# Patient Record
Sex: Female | Born: 1985
Health system: Southern US, Community
[De-identification: ages and names within clinical notes are randomized; demographics above are authoritative.]

## PROBLEM LIST (undated history)

## (undated) DIAGNOSIS — Z8759 Personal history of other complications of pregnancy, childbirth and the puerperium: Secondary | ICD-10-CM

## (undated) DIAGNOSIS — Z789 Other specified health status: Secondary | ICD-10-CM

## (undated) HISTORY — PX: ADENOIDECTOMY: SUR15

## (undated) HISTORY — PX: TYMPANOSTOMY TUBE PLACEMENT: SHX32

## (undated) HISTORY — PX: TONSILLECTOMY: SUR1361

---
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
## 2010-03-16 ENCOUNTER — Other Ambulatory Visit: Payer: Self-pay | Admitting: Obstetrics and Gynecology

## 2010-04-13 ENCOUNTER — Other Ambulatory Visit: Payer: Self-pay | Admitting: Obstetrics and Gynecology

## 2010-08-31 ENCOUNTER — Other Ambulatory Visit: Payer: Self-pay | Admitting: Obstetrics and Gynecology

## 2010-10-16 ENCOUNTER — Other Ambulatory Visit: Payer: Self-pay | Admitting: Obstetrics and Gynecology

## 2012-01-11 ENCOUNTER — Other Ambulatory Visit: Payer: Self-pay

## 2013-02-08 NOTE — L&D Delivery Note (Signed)
Operative Delivery Note  Patient pushed for 50 minutes and there was repetitive late decelerations with pushing. With the presence of thick meconium patient was previously consented for an operative delivery. erbal consent: obtained from patient.  Risks and benefits discussed in detail.  Risks include, but are not limited to the risks of anesthesia, bleeding, infection, damage to maternal tissues, fetal cephalhematoma.  There is also the risk of inability to effect vaginal delivery of the head, or shoulder dystocia that cannot be resolved by established maneuvers, leading to the need for emergency cesarean section. The foley catheter was previously removed, anesthesia was adequate.  The head was confirmed to be LOA. The Kiwi vacuum extractor was placed and with one pull and maternal effort at 7:07 PM a viable and healthy female was delivered via Vaginal, Vacuum Investment banker, operational(Extractor).  Presentation: vertex; Position: Left,, Occiput,, Anterior;  The cord expulsion occurred and the placenta had to be manually removed. A uterine sweep was performed to ensure all membranes and placenta had been removed. A dose of IV Unasyn administered due to the uterine sweep.  The placenta was inspected and noted to be intact   [facility] ; weight  Pending  Meconium suctioned by Neonatal team  Placenta status: , Manual removal.   Cord: 3 vessels with the following complications: .  Cord pH: pending  Anesthesia: Epidural  Instruments: Kiwi  Episiotomy: none Lacerations: 1st degree Suture Repair: 2.0 vicryl Est. Blood Loss (mL): 400  Mom to postpartum.  Baby to Couplet care / Skin to Skin.  Essie HartINN, Keshonna Valvo STACIA 09/22/2013, 7:43 PM

## 2013-03-02 LAB — OB RESULTS CONSOLE HEPATITIS B SURFACE ANTIGEN: Hepatitis B Surface Ag: NEGATIVE

## 2013-03-02 LAB — OB RESULTS CONSOLE ABO/RH: RH Type: POSITIVE

## 2013-03-02 LAB — OB RESULTS CONSOLE GC/CHLAMYDIA
Chlamydia: NEGATIVE
GC PROBE AMP, GENITAL: NEGATIVE

## 2013-03-02 LAB — OB RESULTS CONSOLE ANTIBODY SCREEN: ANTIBODY SCREEN: NEGATIVE

## 2013-03-02 LAB — OB RESULTS CONSOLE HIV ANTIBODY (ROUTINE TESTING): HIV: NONREACTIVE

## 2013-03-02 LAB — OB RESULTS CONSOLE RUBELLA ANTIBODY, IGM: Rubella: IMMUNE

## 2013-03-02 LAB — OB RESULTS CONSOLE RPR: RPR: NONREACTIVE

## 2013-04-04 ENCOUNTER — Other Ambulatory Visit: Payer: Self-pay

## 2013-08-27 LAB — OB RESULTS CONSOLE GBS: GBS: NEGATIVE

## 2013-09-10 ENCOUNTER — Encounter (HOSPITAL_COMMUNITY): Admission: RE | Payer: Self-pay | Source: Ambulatory Visit

## 2013-09-10 ENCOUNTER — Inpatient Hospital Stay (HOSPITAL_COMMUNITY)
Admission: RE | Admit: 2013-09-10 | Payer: BC Managed Care – PPO | Source: Ambulatory Visit | Admitting: Obstetrics and Gynecology

## 2013-09-10 SURGERY — Surgical Case
Anesthesia: Regional

## 2013-09-21 ENCOUNTER — Telehealth (HOSPITAL_COMMUNITY): Payer: Self-pay | Admitting: *Deleted

## 2013-09-21 NOTE — Telephone Encounter (Signed)
Preadmission screen  

## 2013-09-22 ENCOUNTER — Inpatient Hospital Stay (HOSPITAL_COMMUNITY)
Admission: AD | Admit: 2013-09-22 | Discharge: 2013-09-24 | DRG: 774 | Disposition: A | Discharge status: Home / Self Care | Payer: BC Managed Care – PPO | Source: Ambulatory Visit | Attending: Obstetrics & Gynecology | Admitting: Obstetrics & Gynecology

## 2013-09-22 ENCOUNTER — Encounter (HOSPITAL_COMMUNITY): Payer: BC Managed Care – PPO | Admitting: Anesthesiology

## 2013-09-22 ENCOUNTER — Inpatient Hospital Stay (HOSPITAL_COMMUNITY)
Admission: AD | Admit: 2013-09-22 | Discharge: 2013-09-22 | Disposition: A | Discharge status: Home / Self Care | Payer: BC Managed Care – PPO | Source: Ambulatory Visit | Attending: Obstetrics & Gynecology | Admitting: Obstetrics & Gynecology

## 2013-09-22 ENCOUNTER — Inpatient Hospital Stay (HOSPITAL_COMMUNITY): Payer: BC Managed Care – PPO | Admitting: Anesthesiology

## 2013-09-22 ENCOUNTER — Encounter (HOSPITAL_COMMUNITY): Payer: Self-pay | Admitting: *Deleted

## 2013-09-22 DIAGNOSIS — O479 False labor, unspecified: Secondary | ICD-10-CM | POA: Diagnosis present

## 2013-09-22 DIAGNOSIS — O99892 Other specified diseases and conditions complicating childbirth: Secondary | ICD-10-CM | POA: Diagnosis present

## 2013-09-22 DIAGNOSIS — M4180 Other forms of scoliosis, site unspecified: Secondary | ICD-10-CM | POA: Diagnosis present

## 2013-09-22 DIAGNOSIS — O9989 Other specified diseases and conditions complicating pregnancy, childbirth and the puerperium: Secondary | ICD-10-CM

## 2013-09-22 DIAGNOSIS — O480 Post-term pregnancy: Secondary | ICD-10-CM | POA: Diagnosis present

## 2013-09-22 DIAGNOSIS — IMO0001 Reserved for inherently not codable concepts without codable children: Secondary | ICD-10-CM

## 2013-09-22 DIAGNOSIS — Z8759 Personal history of other complications of pregnancy, childbirth and the puerperium: Secondary | ICD-10-CM

## 2013-09-22 HISTORY — DX: Other specified health status: Z78.9

## 2013-09-22 LAB — CBC
HCT: 36.8 % (ref 36.0–46.0)
HEMOGLOBIN: 13.3 g/dL (ref 12.0–15.0)
MCH: 34.4 pg — ABNORMAL HIGH (ref 26.0–34.0)
MCHC: 36.1 g/dL — ABNORMAL HIGH (ref 30.0–36.0)
MCV: 95.1 fL (ref 78.0–100.0)
Platelets: 200 10*3/uL (ref 150–400)
RBC: 3.87 MIL/uL (ref 3.87–5.11)
RDW: 12.9 % (ref 11.5–15.5)
WBC: 19.9 10*3/uL — ABNORMAL HIGH (ref 4.0–10.5)

## 2013-09-22 LAB — CORD BLOOD GAS (ARTERIAL)

## 2013-09-22 MED ORDER — ONDANSETRON HCL 4 MG PO TABS
4.0000 mg | ORAL_TABLET | ORAL | Status: DC | PRN
Start: 2013-09-22 — End: 2013-09-24

## 2013-09-22 MED ORDER — CITRIC ACID-SODIUM CITRATE 334-500 MG/5ML PO SOLN: 30.0000 mL | ORAL | Status: DC | PRN

## 2013-09-22 MED ORDER — TETANUS-DIPHTH-ACELL PERTUSSIS 5-2.5-18.5 LF-MCG/0.5 IM SUSP: 0.5000 mL | Freq: Once | INTRAMUSCULAR | Status: DC

## 2013-09-22 MED ORDER — PHENYLEPHRINE 40 MCG/ML (10ML) SYRINGE FOR IV PUSH (FOR BLOOD PRESSURE SUPPORT)
80.0000 ug | PREFILLED_SYRINGE | INTRAVENOUS | Status: DC | PRN
  Filled 2013-09-22: qty 2

## 2013-09-22 MED ORDER — FENTANYL 2.5 MCG/ML BUPIVACAINE 1/10 % EPIDURAL INFUSION (WH - ANES)
14.0000 mL/h | INTRAMUSCULAR | Status: DC | PRN
  Administered 2013-09-22: 14 mL/h via EPIDURAL

## 2013-09-22 MED ORDER — LANOLIN HYDROUS EX OINT: TOPICAL_OINTMENT | CUTANEOUS | Status: DC | PRN

## 2013-09-22 MED ORDER — DIPHENHYDRAMINE HCL 50 MG/ML IJ SOLN: 12.5000 mg | INTRAMUSCULAR | Status: DC | PRN

## 2013-09-22 MED ORDER — FENTANYL 2.5 MCG/ML BUPIVACAINE 1/10 % EPIDURAL INFUSION (WH - ANES)
INTRAMUSCULAR | Status: DC | PRN
  Administered 2013-09-22: 14 mL/h via EPIDURAL

## 2013-09-22 MED ORDER — SIMETHICONE 80 MG PO CHEW: 80.0000 mg | CHEWABLE_TABLET | ORAL | Status: DC | PRN

## 2013-09-22 MED ORDER — EPHEDRINE 5 MG/ML INJ
10.0000 mg | INTRAVENOUS | Status: DC | PRN
  Filled 2013-09-22: qty 2

## 2013-09-22 MED ORDER — ONDANSETRON HCL 4 MG/2ML IJ SOLN: 4.0000 mg | Freq: Four times a day (QID) | INTRAMUSCULAR | Status: DC | PRN

## 2013-09-22 MED ORDER — PRENATAL MULTIVITAMIN CH: 1.0000 | ORAL_TABLET | Freq: Every day | ORAL | Status: DC

## 2013-09-22 MED ORDER — DIPHENHYDRAMINE HCL 25 MG PO CAPS: 25.0000 mg | ORAL_CAPSULE | Freq: Four times a day (QID) | ORAL | Status: DC | PRN

## 2013-09-22 MED ORDER — ONDANSETRON HCL 4 MG/2ML IJ SOLN: 4.0000 mg | INTRAMUSCULAR | Status: DC | PRN

## 2013-09-22 MED ORDER — OXYTOCIN 40 UNITS IN LACTATED RINGERS INFUSION - SIMPLE MED: 62.5000 mL/h | INTRAVENOUS | Status: DC | PRN

## 2013-09-22 MED ORDER — OXYTOCIN 40 UNITS IN LACTATED RINGERS INFUSION - SIMPLE MED
62.5000 mL/h | INTRAVENOUS | Status: DC
  Filled 2013-09-22: qty 1000

## 2013-09-22 MED ORDER — DIBUCAINE 1 % RE OINT: 1.0000 "application " | TOPICAL_OINTMENT | RECTAL | Status: DC | PRN

## 2013-09-22 MED ORDER — PHENYLEPHRINE 40 MCG/ML (10ML) SYRINGE FOR IV PUSH (FOR BLOOD PRESSURE SUPPORT)
PREFILLED_SYRINGE | INTRAVENOUS | Status: DC
Start: 2013-09-22 — End: 2013-09-22
  Filled 2013-09-22: qty 10

## 2013-09-22 MED ORDER — LACTATED RINGERS IV SOLN: 500.0000 mL | INTRAVENOUS | Status: DC | PRN

## 2013-09-22 MED ORDER — ZOLPIDEM TARTRATE 5 MG PO TABS: 5.0000 mg | ORAL_TABLET | Freq: Every evening | ORAL | Status: DC | PRN

## 2013-09-22 MED ORDER — FLEET ENEMA 7-19 GM/118ML RE ENEM: 1.0000 | ENEMA | RECTAL | Status: DC | PRN

## 2013-09-22 MED ORDER — OXYCODONE-ACETAMINOPHEN 5-325 MG PO TABS: 1.0000 | ORAL_TABLET | ORAL | Status: DC | PRN

## 2013-09-22 MED ORDER — WITCH HAZEL-GLYCERIN EX PADS
1.0000 | MEDICATED_PAD | CUTANEOUS | Status: DC | PRN
Start: 2013-09-22 — End: 2013-09-24

## 2013-09-22 MED ORDER — IBUPROFEN 600 MG PO TABS
600.0000 mg | ORAL_TABLET | Freq: Four times a day (QID) | ORAL | Status: DC
  Administered 2013-09-23 – 2013-09-24 (×5): 600 mg via ORAL
  Filled 2013-09-22 (×6): qty 1

## 2013-09-22 MED ORDER — FENTANYL 2.5 MCG/ML BUPIVACAINE 1/10 % EPIDURAL INFUSION (WH - ANES)
INTRAMUSCULAR | Status: AC
  Filled 2013-09-22: qty 125

## 2013-09-22 MED ORDER — LIDOCAINE HCL (PF) 1 % IJ SOLN
INTRAMUSCULAR | Status: DC | PRN
  Administered 2013-09-22 (×2): 4 mL

## 2013-09-22 MED ORDER — METHYLERGONOVINE MALEATE 0.2 MG/ML IJ SOLN
0.2000 mg | Freq: Once | INTRAMUSCULAR | Status: AC
  Administered 2013-09-22: 0.2 mg via INTRAMUSCULAR
  Filled 2013-09-22: qty 1

## 2013-09-22 MED ORDER — IBUPROFEN 600 MG PO TABS
600.0000 mg | ORAL_TABLET | Freq: Four times a day (QID) | ORAL | Status: DC | PRN
  Administered 2013-09-23: 600 mg via ORAL

## 2013-09-22 MED ORDER — BENZOCAINE-MENTHOL 20-0.5 % EX AERO
1.0000 | INHALATION_SPRAY | CUTANEOUS | Status: DC | PRN
Start: 2013-09-22 — End: 2013-09-24

## 2013-09-22 MED ORDER — LACTATED RINGERS IV SOLN
INTRAVENOUS | Status: DC
  Administered 2013-09-22: 1000 mL via INTRAVENOUS
  Administered 2013-09-22: 700 mL via INTRAVENOUS

## 2013-09-22 MED ORDER — SODIUM CHLORIDE 0.9 % IV SOLN
3.0000 g | Freq: Once | INTRAVENOUS | Status: AC
  Administered 2013-09-22: 3 g via INTRAVENOUS
  Filled 2013-09-22: qty 3

## 2013-09-22 MED ORDER — LACTATED RINGERS IV SOLN: 500.0000 mL | Freq: Once | INTRAVENOUS | Status: DC

## 2013-09-22 MED ORDER — ACETAMINOPHEN 325 MG PO TABS: 650.0000 mg | ORAL_TABLET | ORAL | Status: DC | PRN

## 2013-09-22 MED ORDER — SENNOSIDES-DOCUSATE SODIUM 8.6-50 MG PO TABS
2.0000 | ORAL_TABLET | ORAL | Status: DC
  Administered 2013-09-23 – 2013-09-24 (×2): 2 via ORAL
  Filled 2013-09-22 (×2): qty 2

## 2013-09-22 MED ORDER — LIDOCAINE HCL (PF) 1 % IJ SOLN
30.0000 mL | INTRAMUSCULAR | Status: DC | PRN
  Filled 2013-09-22: qty 30

## 2013-09-22 MED ORDER — OXYTOCIN BOLUS FROM INFUSION: 500.0000 mL | INTRAVENOUS | Status: DC

## 2013-09-22 NOTE — Anesthesia Preprocedure Evaluation (Addendum)
Anesthesia Evaluation  Patient identified by MRN, date of birth, ID band Patient awake    Reviewed: Allergy & Precautions, H&P , Patient's Chart, lab work & pertinent test results  Airway Mallampati: III TM Distance: >3 FB Neck ROM: full    Dental no notable dental hx. (+) Teeth Intact   Pulmonary neg pulmonary ROS,  breath sounds clear to auscultation  Pulmonary exam normal       Cardiovascular negative cardio ROS  Rhythm:regular Rate:Normal     Neuro/Psych negative neurological ROS  negative psych ROS   GI/Hepatic negative GI ROS, Neg liver ROS,   Endo/Other  negative endocrine ROS  Renal/GU negative Renal ROS  negative genitourinary   Musculoskeletal Scoliosis thoracolumbar spine   Abdominal Normal abdominal exam  (+)   Peds  Hematology negative hematology ROS (+)   Anesthesia Other Findings   Reproductive/Obstetrics (+) Pregnancy                          Anesthesia Physical Anesthesia Plan  ASA: II  Anesthesia Plan: Epidural   Post-op Pain Management:    Induction:   Airway Management Planned:   Additional Equipment:   Intra-op Plan:   Post-operative Plan:   Informed Consent: I have reviewed the patients History and Physical, chart, labs and discussed the procedure including the risks, benefits and alternatives for the proposed anesthesia with the patient or authorized representative who has indicated his/her understanding and acceptance.     Plan Discussed with: Anesthesiologist  Anesthesia Plan Comments:         Anesthesia Quick Evaluation

## 2013-09-22 NOTE — Consult Note (Signed)
Neonatology Note:  Attendance at Delivery:  I was asked by Dr. Pinn to attend this vacuum-assisted vaginal delivery at [redacted] weeks GA due to thick meconium and NRFHR. The mother is a G1P0 O pos, GBS neg with an uncomplicated pregnancy. ROM 1 hour before delivery, fluid with thick meconium. At delivery, the baby had good tone; I bulb suctioned a large amount of thick meconium from her mouth and nares while the cord was being clamped and cut. Infant vigorous with good spontaneous cry and tone. The umbilical cord was deeply meconium-stained. We performed DeLee suctioning to the stomach, and only got about 1 ml out. Ap 8/9. Lungs clear to ausc in DR. To CN to care of Pediatrician.  Rhonda Pennington C. Ana Liaw, MD  

## 2013-09-22 NOTE — MAU Note (Signed)
Pt states was 2cm in office at last check, denies bleeding or lof. Unsure of contractions pattern however are "frequent".

## 2013-09-22 NOTE — H&P (Signed)
Rhonda Pennington is a 28 y.o. female G1P0 SIUP at 46 weeks presenting for regular painful contractions.  Maternal Medical History:  Reason for admission: Contractions.   Contractions: Onset was 6-12 hours ago.   Frequency: regular.   Duration is approximately 60 seconds.   Perceived severity is strong.    Fetal activity: Perceived fetal activity is normal.   Last perceived fetal movement was within the past 12 hours.    Prenatal complications: no prenatal complications Prenatal Complications - Diabetes: none.    OB History   Grav Para Term Preterm Abortions TAB SAB Ect Mult Living   1 0 0 0 0 0 0 0 0 0      Past Medical History  Diagnosis Date  . Medical history non-contributory    Past Surgical History  Procedure Laterality Date  . Ovarian cyst surgery Right 2003   Family History: family history is not on file. Social History:  reports that she has never smoked. She has never used smokeless tobacco. She reports that she does not drink alcohol or use illicit drugs.   Prenatal Transfer Tool  Maternal Diabetes: No Genetic Screening: Normal Maternal Ultrasounds/Referrals: Normal Fetal Ultrasounds or other Referrals:  Other: July 17: EFW 3199 grams vertex Maternal Substance Abuse:  No Significant Maternal Medications:  None Significant Maternal Lab Results:  Lab values include: Group B Strep negative Other Comments:  None  Review of Systems  Constitutional: Negative.  Negative for fever.  Eyes: Negative.  Negative for blurred vision.  Respiratory: Negative for cough.   Cardiovascular: Negative for chest pain.  Gastrointestinal: Negative.  Negative for heartburn.  Genitourinary: Negative for dysuria.  Musculoskeletal: Negative for myalgias.  Skin: Negative for rash.  Neurological: Positive for dizziness. Negative for headaches.  Endo/Heme/Allergies: Negative.  Does not bruise/bleed easily.  Psychiatric/Behavioral: Negative for depression.  All other systems reviewed and  are negative.   Dilation: 10 Effacement (%): 100 Station: +2 Exam by:: Dr Mora Appl Blood pressure 122/75, pulse 77, temperature 98 F (36.7 C), temperature source Oral, resp. rate 20, height 5\' 7"  (1.702 m), weight 80.513 kg (177 lb 8 oz), SpO2 100.00%. Maternal Exam:  Uterine Assessment: Contraction strength is moderate.  Contraction duration is 60 seconds. Contraction frequency is regular.   Abdomen: Patient reports no abdominal tenderness. Fundal height is 40 cm.   Estimated fetal weight is 3400 grams.   Fetal presentation: vertex  Introitus: Normal vulva. Normal vagina.  Ferning test: not done.  Nitrazine test: not done. Amniotic fluid character: not assessed.  Pelvis: adequate for delivery.   Cervix: On admission Cvx: 4/ 90/-1   Fetal Exam Fetal Monitor Review: Mode: hand-held doppler probe.   Baseline rate: 150.  Variability: minimal (<5 bpm).   Pattern: late decelerations and accelerations present.    Fetal State Assessment: Category II - tracings are indeterminate.     Physical Exam  Vitals reviewed. Constitutional: She is oriented to person, place, and time. She appears well-developed and well-nourished.  HENT:  Head: Normocephalic.  Eyes: Pupils are equal, round, and reactive to light.  Neck: Normal range of motion.  Cardiovascular: Normal rate and regular rhythm.   Respiratory: Effort normal.  GI: Soft.  Musculoskeletal: Normal range of motion.  Neurological: She is alert and oriented to person, place, and time.  Skin: Skin is warm.    Prenatal labs: ABO, Rh: O/Positive/-- (01/23 0000) Antibody: Negative (01/23 0000) Rubella: Immune (01/23 0000) RPR: Nonreactive (01/23 0000)  HBsAg: Negative (01/23 0000)  HIV: Non-reactive (01/23 0000)  GBS:  Negative (07/20 0000)   Assessment/Plan: SIUP at 41 weeks in early active labor Cat II tracing Will admit to Labor and Delivery Epidural  Continuous monitoring   Dewey Neukam STACIA 09/22/2013, 6:40  PM

## 2013-09-22 NOTE — Progress Notes (Signed)
Rhonda Pennington is a 28 y.o. G1P0000 at 6774w0d by LMP admitted for active labor  Subjective: Patient feeling more pressure  Objective: BP 122/75  Pulse 77  Temp(Src) 98 F (36.7 C) (Oral)  Resp 20  Ht 5\' 7"  (1.702 m)  Wt 80.513 kg (177 lb 8 oz)  BMI 27.79 kg/m2  SpO2 100%      FHT:  FHR: 150 bpm, variability: minimal ,  accelerations:  Abscent,  decelerations:  Present intermittent late decelerations UC:   regular, every 2-3 minutes SVE:   Dilation: 10 Effacement (%): 100 Station: +2 Exam by:: Dr Mora ApplPinn  AROM: thick meconium  Labs: Lab Results  Component Value Date   WBC 19.9* 09/22/2013   HGB 13.3 09/22/2013   HCT 36.8 09/22/2013   MCV 95.1 09/22/2013   PLT 200 09/22/2013    Assessment / Plan: Spontaneous labor, progressing normally  Labor: Progressing normally Preeclampsia:  no signs or symptoms of toxicity Fetal Wellbeing:  Category II Pain Control:  Epidural I/D:  n/a Anticipated MOD:  NSVD  Will have Peds at time of delivery due to Cat II tracing and Thick meconium   Rhonda Pennington Rhonda Pennington 09/22/2013, 6:46 PM

## 2013-09-22 NOTE — Anesthesia Procedure Notes (Signed)
Epidural Patient location during procedure: OB Start time: 09/22/2013 4:40 PM  Staffing Anesthesiologist: Demonta Wombles A. Performed by: anesthesiologist   Preanesthetic Checklist Completed: patient identified, site marked, surgical consent, pre-op evaluation, timeout performed, IV checked, risks and benefits discussed and monitors and equipment checked  Epidural Patient position: sitting Prep: site prepped and draped and DuraPrep Patient monitoring: continuous pulse ox and blood pressure Approach: midline Location: L3-L4 Injection technique: LOR air  Needle:  Needle type: Tuohy  Needle gauge: 17 G Needle length: 9 cm and 9 Needle insertion depth: 5 cm cm Catheter type: closed end flexible Catheter size: 19 Gauge Catheter at skin depth: 10 cm Test dose: negative and Other  Assessment Events: blood not aspirated, injection not painful, no injection resistance, negative IV test and no paresthesia  Additional Notes Patient identified. Risks and benefits discussed including failed block, incomplete  Pain control, post dural puncture headache, nerve damage, paralysis, blood pressure Changes, nausea, vomiting, reactions to medications-both toxic and allergic and post Partum back pain. All questions were answered. Patient expressed understanding and wished to proceed. Sterile technique was used throughout procedure. Epidural site was Dressed with sterile barrier dressing. No paresthesias, signs of intravascular injection Or signs of intrathecal spread were encountered.  Patient was more comfortable after the epidural was dosed. Please see RN's note for documentation of vital signs and FHR which are stable.

## 2013-09-23 ENCOUNTER — Inpatient Hospital Stay (HOSPITAL_COMMUNITY): Admission: RE | Admit: 2013-09-23 | Payer: BC Managed Care – PPO | Source: Ambulatory Visit

## 2013-09-23 LAB — CBC
HCT: 32.9 % — ABNORMAL LOW (ref 36.0–46.0)
Hemoglobin: 11.8 g/dL — ABNORMAL LOW (ref 12.0–15.0)
MCH: 34.3 pg — AB (ref 26.0–34.0)
MCHC: 35.9 g/dL (ref 30.0–36.0)
MCV: 95.6 fL (ref 78.0–100.0)
PLATELETS: 186 10*3/uL (ref 150–400)
RBC: 3.44 MIL/uL — ABNORMAL LOW (ref 3.87–5.11)
RDW: 13 % (ref 11.5–15.5)
WBC: 18.5 10*3/uL — ABNORMAL HIGH (ref 4.0–10.5)

## 2013-09-23 LAB — ABO/RH: ABO/RH(D): O POS

## 2013-09-23 LAB — RPR

## 2013-09-23 NOTE — Lactation Note (Signed)
This note was copied from the chart of [facility]. Lactation Consultation Note  Patient Name: Rhonda Avie ArenasStacey Pennington ZOXWR'UToday's Date: 09/23/2013 Reason for consult: Initial assessment Baby 21 hours of life. Mom has room full of visitors, ask LC to come back later. Enc mom to call out for LC at next feeding. [facility].   Maternal Data    Feeding Feeding Type: Breast Fed Length of feed: 15 min  LATCH Score/Interventions Latch: Grasps breast easily, tongue down, lips flanged, rhythmical sucking.  Audible Swallowing: None  Type of Nipple: Everted at rest and after stimulation  Comfort (Breast/Nipple): Soft / non-tender     Hold (Positioning): No assistance needed to correctly position infant at breast.  LATCH Score: 8  Lactation Tools Discussed/Used     Consult Status Consult Status: Follow-up Date: 09/24/13 Follow-up type: In-patient    Geralynn OchsWILLIARD, Rhonda Pennington 09/23/2013, 4:33 PM

## 2013-09-23 NOTE — Progress Notes (Signed)
Post Partum Day 1 Subjective: no complaints, up ad lib, voiding, tolerating PO, + flatus and Mother is breast feeding, bonding well with baby  Objective: Blood pressure 131/86, pulse 67, temperature 98.8 F (37.1 C), temperature source Oral, resp. rate 18, height 5\' 7"  (1.702 m), weight 80.513 kg (177 lb 8 oz), SpO2 98.00%, unknown if currently breastfeeding.  Physical Exam:  General: alert, cooperative and no distress Lochia: appropriate Uterine Fundus: firm DVT Evaluation: No evidence of DVT seen on physical exam. Negative Homan's sign. No cords or calf tenderness.   Recent Labs  09/22/13 1557 09/23/13 0549  HGB 13.3 11.8*  HCT 36.8 32.9*    Assessment/Plan: Plan for discharge tomorrow and Breastfeeding   LOS: 1 day   Rhonda Pennington STACIA 09/23/2013, 10:38 AM

## 2013-09-23 NOTE — Anesthesia Postprocedure Evaluation (Signed)
  Anesthesia Post-op Note  [facility]  Procedure(s) Performed: * No procedures listed *  Patient Location: Mother/Baby  Anesthesia Type:Epidural  Level of Consciousness: awake and alert   Airway and Oxygen Therapy: Patient Spontanous Breathing  Post-op Pain: mild  Post-op Assessment: Post-op Vital signs reviewed, No signs of Nausea or vomiting, Pain level controlled, No headache, No residual numbness and No residual motor weakness  Post-op Vital Signs: Reviewed  Last Vitals:  Filed Vitals:   09/22/13 2309  BP: 131/86  Pulse: 67  Temp: 37.1 C  Resp: 18    Complications: No apparent anesthesia complications

## 2013-09-24 MED ORDER — OXYCODONE-ACETAMINOPHEN 5-325 MG PO TABS: 1.0000 | ORAL_TABLET | ORAL | Status: DC | PRN

## 2013-09-24 NOTE — Progress Notes (Signed)
PPD#2 Pt without complaints. Would like to go home. VSSAF IMP/ stable Plan/ Will discharge.  

## 2013-09-24 NOTE — Lactation Note (Signed)
This note was copied from the chart of [facility]. Lactation Consultation Note Went into rm. Multiple times, mom sleeping. RN fitted mom w/#20 NS.  Patient Name: Rhonda Pennington QIHKV'QToday's Date: 09/24/2013     Maternal Data    Feeding Feeding Type: Formula Nipple Type: Slow - flow  LATCH Score/Interventions                      Lactation Tools Discussed/Used     Consult Status      Charyl DancerCARVER, Lalita Ebel G 09/24/2013, 6:27 AM

## 2013-09-24 NOTE — Lactation Note (Signed)
This note was copied from the chart of Rhonda Pennington. Lactation Consultation Note  Per Odis LusterJennifer Gray RN Mother states she does not need to see Lactation prior to discharge. Victorino DikeJennifer reviewed pumping options and rental pumps if needed.  Mom is considering primarily formula feeding at this time.  Patient Name: Rhonda Avie ArenasStacey Pennington ZOXWR'UToday's Date: 09/24/2013     Maternal Data    Feeding Feeding Type: Bottle Fed - Formula Nipple Type: Slow - flow  LATCH Score/Interventions                      Lactation Tools Discussed/Used     Consult Status      Hardie PulleyBerkelhammer, Ruth Boschen 09/24/2013, 8:49 AM

## 2013-09-24 NOTE — Discharge Summary (Signed)
Obstetric Discharge Summary Reason for Admission: onset of labor Prenatal Procedures: ultrasound Intrapartum Procedures: vacuum Postpartum Procedures: none Complications-Operative and Postpartum: 1st degree perineal laceration Hemoglobin  Date Value Ref Range Status  09/23/2013 11.8* 12.0 - 15.0 g/dL Final     HCT  Date Value Ref Range Status  09/23/2013 32.9* 36.0 - 46.0 % Final    Physical Exam:  General: alert Lochia: appropriate Uterine Fundus: firm   Discharge Diagnoses: Term Pregnancy-delivered  Discharge Information: Date: 09/24/2013 Activity: pelvic rest Diet: routine Medications: PNV and Percocet Condition: stable Instructions: refer to practice specific booklet Discharge to: home Follow-up Information   [facility] Mae[facility]ALDA STACIA, MD. Schedule an appointment as soon as possible for a visit in 1 month.   Specialty:  Obstetrics and Gynecology   Contact information:   [address] [place] [id number]       Newborn Data: Live born female  Birth Weight: 6 lb 14.8 oz (3141 g) APGAR: 8, 9  Home with mother.  Rhonda Pennington E 09/24/2013, 8:59 AM

## 2013-12-10 ENCOUNTER — Encounter (HOSPITAL_COMMUNITY): Payer: Self-pay | Admitting: *Deleted

## 2014-02-02 ENCOUNTER — Emergency Department (HOSPITAL_COMMUNITY)
Admission: EM | Admit: 2014-02-02 | Discharge: 2014-02-02 | Disposition: A | Discharge status: Home / Self Care | Payer: BC Managed Care – PPO | Attending: Emergency Medicine | Admitting: Emergency Medicine

## 2014-02-02 DIAGNOSIS — Y998 Other external cause status: Secondary | ICD-10-CM | POA: Insufficient documentation

## 2014-02-02 DIAGNOSIS — S199XXA Unspecified injury of neck, initial encounter: Secondary | ICD-10-CM | POA: Diagnosis present

## 2014-02-02 DIAGNOSIS — Z79891 Long term (current) use of opiate analgesic: Secondary | ICD-10-CM | POA: Insufficient documentation

## 2014-02-02 DIAGNOSIS — S4992XA Unspecified injury of left shoulder and upper arm, initial encounter: Secondary | ICD-10-CM | POA: Diagnosis not present

## 2014-02-02 DIAGNOSIS — Y9389 Activity, other specified: Secondary | ICD-10-CM | POA: Diagnosis not present

## 2014-02-02 DIAGNOSIS — Y92410 Unspecified street and highway as the place of occurrence of the external cause: Secondary | ICD-10-CM | POA: Diagnosis not present

## 2014-02-02 DIAGNOSIS — S29001A Unspecified injury of muscle and tendon of front wall of thorax, initial encounter: Secondary | ICD-10-CM | POA: Diagnosis not present

## 2014-02-02 DIAGNOSIS — Z79899 Other long term (current) drug therapy: Secondary | ICD-10-CM | POA: Diagnosis not present

## 2014-02-02 MED ORDER — ACETAMINOPHEN 500 MG PO TABS
1000.0000 mg | ORAL_TABLET | Freq: Once | ORAL | Status: AC
  Administered 2014-02-02: 1000 mg via ORAL
  Filled 2014-02-02: qty 2

## 2014-02-02 NOTE — ED Notes (Signed)
DISCHARGE INSTRUCTIONS REVIEWED WITH PT

## 2014-02-02 NOTE — Discharge Instructions (Signed)
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
## 2014-03-04 ENCOUNTER — Other Ambulatory Visit: Payer: Self-pay | Admitting: Internal Medicine

## 2014-03-04 ENCOUNTER — Ambulatory Visit
Admission: RE | Admit: 2014-03-04 | Discharge: 2014-03-04 | Disposition: A | Discharge status: Home / Self Care | Payer: BLUE CROSS/BLUE SHIELD | Source: Ambulatory Visit | Attending: Internal Medicine | Admitting: Internal Medicine

## 2014-03-04 DIAGNOSIS — Q742 Other congenital malformations of lower limb(s), including pelvic girdle: Secondary | ICD-10-CM
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
## 2016-03-15 IMAGING — CR DG TIBIA/FIBULA 2V*L*
2 series · 2 of 2 positions shown · non-contrast
Comparison: [REDACTED] of Julija Andreja from 07/09/2008
(date somewhat difficult to read on films but appears to be from
5323).

CLINICAL DATA: New distal leg pain with known bony tumor in the
distal left tibia.

EXAM:
LEFT TIBIA AND FIBULA - 2 VIEW

[view not recorded (1 of 2)]
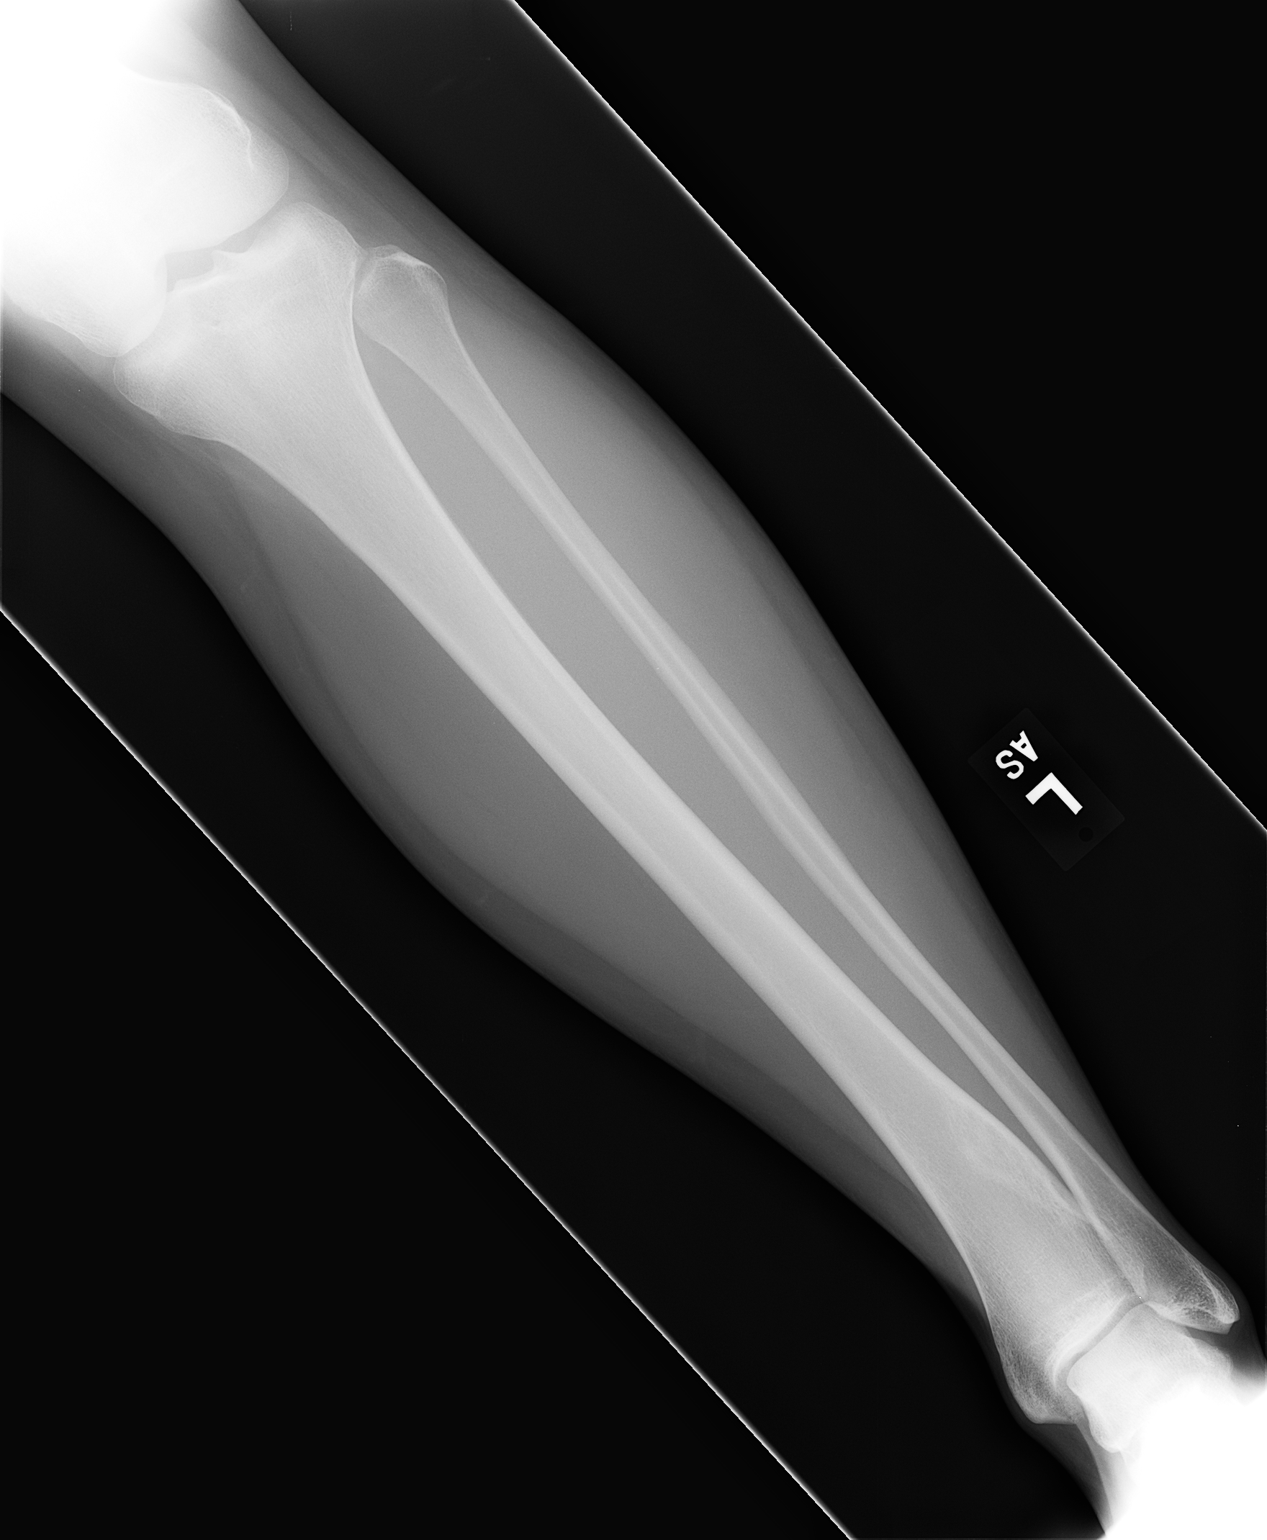

[view not recorded (2 of 2)]
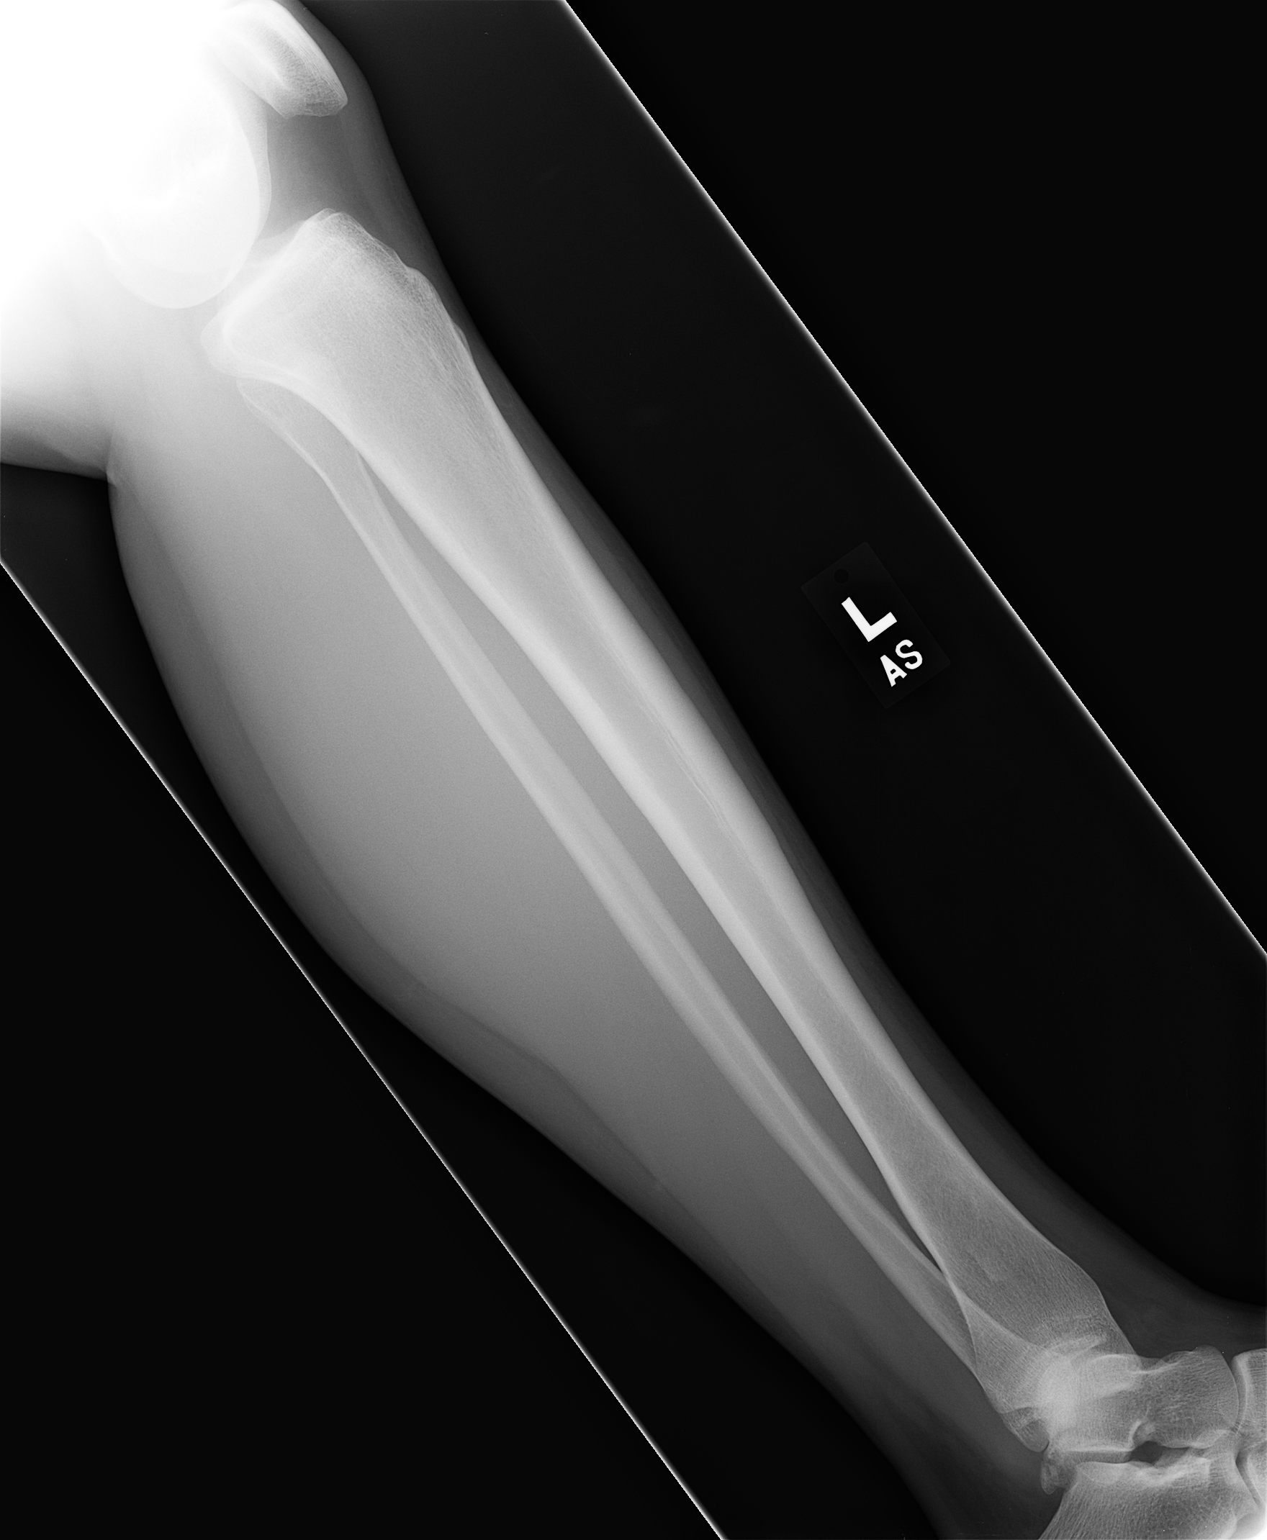

[2 of 2 positions shown; findings below may reference images not displayed]

FINDINGS: No evidence for fracture. No worrisome lytic lesion. There is a
cortically based lesion in the lateral aspect of the distal tibial
metaphysis, completely unchanged when compared to the films from
nearly 6 years ago. There is no evidence for cortical irregularity
or periosteal new bone formation. Imaging features are compatible
with a healed nonossifying fibroma.
IMPRESSION: Stable appearance of a healed nonossifying fibroma in the distal
tibial metaphysis. No new or progressive interval findings.

## 2016-07-14 LAB — OB RESULTS CONSOLE RPR: RPR: NONREACTIVE

## 2016-07-14 LAB — OB RESULTS CONSOLE HEPATITIS B SURFACE ANTIGEN: HEP B S AG: NEGATIVE

## 2016-07-14 LAB — OB RESULTS CONSOLE GC/CHLAMYDIA
CHLAMYDIA, DNA PROBE: NEGATIVE
Gonorrhea: NEGATIVE

## 2016-07-14 LAB — OB RESULTS CONSOLE ABO/RH: RH TYPE: POSITIVE

## 2016-07-14 LAB — OB RESULTS CONSOLE RUBELLA ANTIBODY, IGM: Rubella: IMMUNE

## 2016-07-14 LAB — OB RESULTS CONSOLE ANTIBODY SCREEN: Antibody Screen: NEGATIVE

## 2016-07-14 LAB — OB RESULTS CONSOLE HIV ANTIBODY (ROUTINE TESTING): HIV: NONREACTIVE

## 2016-12-27 LAB — OB RESULTS CONSOLE GBS: GBS: NEGATIVE

## 2017-01-06 ENCOUNTER — Other Ambulatory Visit: Payer: Self-pay | Admitting: Obstetrics and Gynecology

## 2017-01-07 ENCOUNTER — Telehealth (HOSPITAL_COMMUNITY): Payer: Self-pay | Admitting: *Deleted

## 2017-01-07 ENCOUNTER — Encounter (HOSPITAL_COMMUNITY): Payer: Self-pay | Admitting: *Deleted

## 2017-01-07 NOTE — Telephone Encounter (Signed)
Preadmission screen  

## 2017-01-20 ENCOUNTER — Inpatient Hospital Stay (HOSPITAL_COMMUNITY)
Admission: RE | Admit: 2017-01-20 | Discharge: 2017-01-22 | DRG: 768 | Disposition: A | Discharge status: Home / Self Care | Payer: Self-pay | Source: Ambulatory Visit | Attending: Obstetrics and Gynecology | Admitting: Obstetrics and Gynecology

## 2017-01-20 ENCOUNTER — Inpatient Hospital Stay (HOSPITAL_COMMUNITY): Payer: Self-pay | Admitting: Anesthesiology

## 2017-01-20 ENCOUNTER — Encounter (HOSPITAL_COMMUNITY): Payer: Self-pay

## 2017-01-20 DIAGNOSIS — Z3A39 39 weeks gestation of pregnancy: Secondary | ICD-10-CM

## 2017-01-20 DIAGNOSIS — O9902 Anemia complicating childbirth: Principal | ICD-10-CM | POA: Diagnosis present

## 2017-01-20 DIAGNOSIS — D649 Anemia, unspecified: Secondary | ICD-10-CM | POA: Diagnosis present

## 2017-01-20 LAB — COMPREHENSIVE METABOLIC PANEL
ALK PHOS: 193 U/L — AB (ref 38–126)
ALT: 15 U/L (ref 14–54)
ANION GAP: 8 (ref 5–15)
AST: 27 U/L (ref 15–41)
Albumin: 2.9 g/dL — ABNORMAL LOW (ref 3.5–5.0)
BILIRUBIN TOTAL: 0.5 mg/dL (ref 0.3–1.2)
BUN: 11 mg/dL (ref 6–20)
CALCIUM: 8.7 mg/dL — AB (ref 8.9–10.3)
CO2: 23 mmol/L (ref 22–32)
Chloride: 106 mmol/L (ref 101–111)
Creatinine, Ser: 0.64 mg/dL (ref 0.44–1.00)
GFR calc non Af Amer: >60 mL/min (ref >60)
GLUCOSE: 74 mg/dL (ref 65–99)
Potassium: 4.3 mmol/L (ref 3.5–5.1)
Sodium: 137 mmol/L (ref 135–145)
TOTAL PROTEIN: 6.2 g/dL — AB (ref 6.5–8.1)

## 2017-01-20 LAB — RPR: RPR Ser Ql: NONREACTIVE

## 2017-01-20 LAB — CBC
HEMATOCRIT: 33.3 % — AB (ref 36.0–46.0)
HEMATOCRIT: 34.5 % — AB (ref 36.0–46.0)
HEMOGLOBIN: 10.9 g/dL — AB (ref 12.0–15.0)
HEMOGLOBIN: 11.2 g/dL — AB (ref 12.0–15.0)
MCH: 30 pg (ref 26.0–34.0)
MCH: 29.8 pg (ref 26.0–34.0)
MCHC: 32.7 g/dL (ref 30.0–36.0)
MCHC: 32.5 g/dL (ref 30.0–36.0)
MCV: 91.7 fL (ref 78.0–100.0)
MCV: 91.8 fL (ref 78.0–100.0)
Platelets: 192 10*3/uL (ref 150–400)
Platelets: 232 10*3/uL (ref 150–400)
RBC: 3.63 MIL/uL — AB (ref 3.87–5.11)
RBC: 3.76 MIL/uL — AB (ref 3.87–5.11)
RDW: 13.2 % (ref 11.5–15.5)
RDW: 13.3 % (ref 11.5–15.5)
WBC: 8.9 10*3/uL (ref 4.0–10.5)
WBC: 17.3 10*3/uL — ABNORMAL HIGH (ref 4.0–10.5)

## 2017-01-20 LAB — PROTEIN / CREATININE RATIO, URINE
Creatinine, Urine: 101 mg/dL
PROTEIN CREATININE RATIO: 0.21 mg/mg{creat} — AB (ref 0.00–0.15)
Total Protein, Urine: 21 mg/dL

## 2017-01-20 LAB — URIC ACID: Uric Acid, Serum: 6.4 mg/dL (ref 2.3–6.6)

## 2017-01-20 LAB — POSTPARTUM HEMORRHAGE PROTOCOL (BB NOTIFICATION)

## 2017-01-20 LAB — PREPARE RBC (CROSSMATCH)

## 2017-01-20 MED ORDER — SODIUM CHLORIDE 0.9 % IV SOLN: Freq: Once | INTRAVENOUS | Status: DC

## 2017-01-20 MED ORDER — DIBUCAINE 1 % RE OINT: 1.0000 "application " | TOPICAL_OINTMENT | RECTAL | Status: DC | PRN

## 2017-01-20 MED ORDER — MEASLES, MUMPS & RUBELLA VAC ~~LOC~~ INJ: 0.5000 mL | INJECTION | Freq: Once | SUBCUTANEOUS | Status: DC

## 2017-01-20 MED ORDER — METHYLERGONOVINE MALEATE 0.2 MG/ML IJ SOLN: 0.2000 mg | INTRAMUSCULAR | Status: DC | PRN

## 2017-01-20 MED ORDER — METHYLERGONOVINE MALEATE 0.2 MG/ML IJ SOLN
0.2000 mg | Freq: Once | INTRAMUSCULAR | Status: AC
  Administered 2017-01-20: 0.2 mg via INTRAMUSCULAR

## 2017-01-20 MED ORDER — OXYTOCIN 40 UNITS IN LACTATED RINGERS INFUSION - SIMPLE MED: 2.5000 [IU]/h | INTRAVENOUS | Status: DC | PRN

## 2017-01-20 MED ORDER — METHYLERGONOVINE MALEATE 0.2 MG PO TABS: 0.2000 mg | ORAL_TABLET | ORAL | Status: DC | PRN

## 2017-01-20 MED ORDER — IBUPROFEN 800 MG PO TABS
800.0000 mg | ORAL_TABLET | Freq: Three times a day (TID) | ORAL | Status: DC
  Administered 2017-01-20 – 2017-01-22 (×6): 800 mg via ORAL
  Filled 2017-01-20 (×6): qty 1

## 2017-01-20 MED ORDER — DIPHENHYDRAMINE HCL 50 MG/ML IJ SOLN: 12.5000 mg | INTRAMUSCULAR | Status: DC | PRN

## 2017-01-20 MED ORDER — EPHEDRINE 5 MG/ML INJ
10.0000 mg | INTRAVENOUS | Status: DC | PRN
  Filled 2017-01-20: qty 2

## 2017-01-20 MED ORDER — LACTATED RINGERS IV SOLN: 500.0000 mL | INTRAVENOUS | Status: DC | PRN

## 2017-01-20 MED ORDER — TERBUTALINE SULFATE 1 MG/ML IJ SOLN: 0.2500 mg | Freq: Once | INTRAMUSCULAR | Status: DC | PRN

## 2017-01-20 MED ORDER — PHENYLEPHRINE 40 MCG/ML (10ML) SYRINGE FOR IV PUSH (FOR BLOOD PRESSURE SUPPORT)
80.0000 ug | PREFILLED_SYRINGE | INTRAVENOUS | Status: DC | PRN
  Filled 2017-01-20: qty 10
  Filled 2017-01-20: qty 5

## 2017-01-20 MED ORDER — MAGNESIUM HYDROXIDE 400 MG/5ML PO SUSP: 30.0000 mL | ORAL | Status: DC | PRN

## 2017-01-20 MED ORDER — WITCH HAZEL-GLYCERIN EX PADS: 1.0000 "application " | MEDICATED_PAD | CUTANEOUS | Status: DC | PRN

## 2017-01-20 MED ORDER — OXYTOCIN 40 UNITS IN LACTATED RINGERS INFUSION - SIMPLE MED
1.0000 m[IU]/min | INTRAVENOUS | Status: DC
  Administered 2017-01-20: 2 m[IU]/min via INTRAVENOUS

## 2017-01-20 MED ORDER — PHENYLEPHRINE 40 MCG/ML (10ML) SYRINGE FOR IV PUSH (FOR BLOOD PRESSURE SUPPORT)
80.0000 ug | PREFILLED_SYRINGE | INTRAVENOUS | Status: DC | PRN
  Filled 2017-01-20: qty 5

## 2017-01-20 MED ORDER — LACTATED RINGERS IV SOLN
INTRAVENOUS | Status: DC
  Administered 2017-01-20 (×2): via INTRAVENOUS

## 2017-01-20 MED ORDER — ACETAMINOPHEN 325 MG PO TABS
650.0000 mg | ORAL_TABLET | ORAL | Status: DC | PRN
  Administered 2017-01-20: 650 mg via ORAL
  Filled 2017-01-20: qty 2

## 2017-01-20 MED ORDER — OXYTOCIN 40 UNITS IN LACTATED RINGERS INFUSION - SIMPLE MED: 1.0000 m[IU]/min | INTRAVENOUS | Status: DC

## 2017-01-20 MED ORDER — COCONUT OIL OIL: 1.0000 "application " | TOPICAL_OIL | Status: DC | PRN

## 2017-01-20 MED ORDER — LIDOCAINE HCL (PF) 1 % IJ SOLN
30.0000 mL | INTRAMUSCULAR | Status: DC | PRN
  Filled 2017-01-20: qty 30

## 2017-01-20 MED ORDER — MISOPROSTOL 200 MCG PO TABS
ORAL_TABLET | ORAL | Status: AC
  Administered 2017-01-20: 800 ug via ORAL
  Filled 2017-01-20: qty 4

## 2017-01-20 MED ORDER — ACETAMINOPHEN 325 MG PO TABS: 650.0000 mg | ORAL_TABLET | ORAL | Status: DC | PRN

## 2017-01-20 MED ORDER — OXYTOCIN 40 UNITS IN LACTATED RINGERS INFUSION - SIMPLE MED
2.5000 [IU]/h | INTRAVENOUS | Status: DC
  Filled 2017-01-20: qty 1000

## 2017-01-20 MED ORDER — LACTATED RINGERS IV SOLN: 500.0000 mL | Freq: Once | INTRAVENOUS | Status: DC

## 2017-01-20 MED ORDER — ONDANSETRON HCL 4 MG/2ML IJ SOLN: 4.0000 mg | Freq: Four times a day (QID) | INTRAMUSCULAR | Status: DC | PRN

## 2017-01-20 MED ORDER — ZOLPIDEM TARTRATE 5 MG PO TABS: 5.0000 mg | ORAL_TABLET | Freq: Every evening | ORAL | Status: DC | PRN

## 2017-01-20 MED ORDER — PRENATAL MULTIVITAMIN CH
1.0000 | ORAL_TABLET | Freq: Every day | ORAL | Status: DC
  Administered 2017-01-22: 1 via ORAL
  Filled 2017-01-20: qty 1

## 2017-01-20 MED ORDER — FENTANYL 2.5 MCG/ML BUPIVACAINE 1/10 % EPIDURAL INFUSION (WH - ANES)
14.0000 mL/h | INTRAMUSCULAR | Status: DC | PRN
  Administered 2017-01-20: 14 mL/h via EPIDURAL
  Filled 2017-01-20: qty 100

## 2017-01-20 MED ORDER — SENNOSIDES-DOCUSATE SODIUM 8.6-50 MG PO TABS
2.0000 | ORAL_TABLET | ORAL | Status: DC
  Administered 2017-01-20 – 2017-01-22 (×2): 2 via ORAL
  Filled 2017-01-20 (×2): qty 2

## 2017-01-20 MED ORDER — ONDANSETRON HCL 4 MG PO TABS: 4.0000 mg | ORAL_TABLET | ORAL | Status: DC | PRN

## 2017-01-20 MED ORDER — OXYTOCIN BOLUS FROM INFUSION
500.0000 mL | Freq: Once | INTRAVENOUS | Status: AC
  Administered 2017-01-20: 500 mL via INTRAVENOUS

## 2017-01-20 MED ORDER — LACTATED RINGERS IV SOLN
500.0000 mL | Freq: Once | INTRAVENOUS | Status: AC
  Administered 2017-01-20: 500 mL via INTRAVENOUS

## 2017-01-20 MED ORDER — TETANUS-DIPHTH-ACELL PERTUSSIS 5-2.5-18.5 LF-MCG/0.5 IM SUSP: 0.5000 mL | Freq: Once | INTRAMUSCULAR | Status: DC

## 2017-01-20 MED ORDER — SOD CITRATE-CITRIC ACID 500-334 MG/5ML PO SOLN: 30.0000 mL | ORAL | Status: DC | PRN

## 2017-01-20 MED ORDER — DIPHENHYDRAMINE HCL 25 MG PO CAPS: 25.0000 mg | ORAL_CAPSULE | Freq: Four times a day (QID) | ORAL | Status: DC | PRN

## 2017-01-20 MED ORDER — BENZOCAINE-MENTHOL 20-0.5 % EX AERO
1.0000 "application " | INHALATION_SPRAY | CUTANEOUS | Status: DC | PRN
  Administered 2017-01-21: 1 via TOPICAL
  Filled 2017-01-20: qty 56

## 2017-01-20 MED ORDER — ONDANSETRON HCL 4 MG/2ML IJ SOLN: 4.0000 mg | INTRAMUSCULAR | Status: DC | PRN

## 2017-01-20 MED ORDER — LIDOCAINE HCL (PF) 1 % IJ SOLN
INTRAMUSCULAR | Status: DC | PRN
  Administered 2017-01-20 (×2): 2 mL via EPIDURAL
  Administered 2017-01-20: 5 mL via EPIDURAL

## 2017-01-20 MED ORDER — SIMETHICONE 80 MG PO CHEW: 80.0000 mg | CHEWABLE_TABLET | ORAL | Status: DC | PRN

## 2017-01-20 MED ORDER — PANTOPRAZOLE SODIUM 40 MG PO TBEC
40.0000 mg | DELAYED_RELEASE_TABLET | Freq: Every day | ORAL | Status: DC
  Administered 2017-01-21 – 2017-01-22 (×2): 40 mg via ORAL
  Filled 2017-01-20 (×2): qty 1

## 2017-01-20 MED ORDER — OXYCODONE-ACETAMINOPHEN 5-325 MG PO TABS: 2.0000 | ORAL_TABLET | ORAL | Status: DC | PRN

## 2017-01-20 MED ORDER — FERROUS SULFATE 325 (65 FE) MG PO TABS
325.0000 mg | ORAL_TABLET | Freq: Two times a day (BID) | ORAL | Status: DC
  Administered 2017-01-21 – 2017-01-22 (×3): 325 mg via ORAL
  Filled 2017-01-20 (×3): qty 1

## 2017-01-20 NOTE — Progress Notes (Signed)
Pt stable and comfortable, just thirsty. Bleeding now less than drips.  Total blood loss since Bakri filled totally is 100 cc.  Vitals:   01/20/17 1557 01/20/17 1559 01/20/17 1601 01/20/17 1603  BP: (!) 142/85 (!) 139/52 102/70 (!) 122/92  Pulse: (!) 104 (!) 108 (!) 114 98  Resp:      Temp:      TempSrc:      SpO2:      Weight:      Height:       Fundus at umbilicus.    Will do extended recovery here at L&D.  If anymore bleeding not stopped immediately with fundal massage will need to go to OR and start with D&C.  Pt understands. Ok for clears right now. Pt given fall precautions.  D/w pt scenarios where she would be offered or might require a blood transfusion.  Consent signed but did not review all risks just yet.

## 2017-01-20 NOTE — Anesthesia Pain Management Evaluation Note (Signed)
  CRNA Pain Management Visit Note  Patient: Avie ArenasStacey Lacko, 31 y.o., female  "Hello I am a member of the anesthesia team at [facility]. We have an anesthesia team available at all times to provide care throughout the hospital, including epidural management and anesthesia for C-section. I don't know your plan for the delivery whether it a natural birth, water birth, IV sedation, nitrous supplementation, doula or epidural, but we want to meet your pain goals."   1.Was your pain managed to your expectations on prior hospitalizations?   Yes   2.What is your expectation for pain management during this hospitalization?     Epidural  3.How can we help you reach that goal? Pain management as needed  Record the patient's initial score and the patient's pain goal.   Pain: 0  Pain Goal: 3 [facility] wants you to be able to say your pain was always managed very well.  Cleda ClarksBrowder, Sayward Horvath R 01/20/2017

## 2017-01-20 NOTE — Anesthesia Procedure Notes (Signed)
Epidural Patient location during procedure: OB Start time: 01/20/2017 9:38 AM End time: 01/20/2017 9:45 AM  Staffing Anesthesiologist: Cecile Hearingurk, Alanmichael Barmore Edward, MD Performed: anesthesiologist   Preanesthetic Checklist Completed: patient identified, pre-op evaluation, timeout performed, IV checked, risks and benefits discussed and monitors and equipment checked  Epidural Patient position: sitting Prep: DuraPrep Patient monitoring: blood pressure and continuous pulse ox Approach: midline Location: L3-L4 Injection technique: LOR air  Needle:  Needle type: Tuohy  Needle gauge: 17 G Needle length: 9 cm Needle insertion depth: 6 cm Catheter size: 19 Gauge Catheter at skin depth: 11 cm Test dose: negative and Other (1% Lidocaine)  Additional Notes Patient identified.  Risk benefits discussed including failed block, incomplete pain control, headache, nerve damage, paralysis, blood pressure changes, nausea, vomiting, reactions to medication both toxic or allergic, and postpartum back pain.  Patient expressed understanding and wished to proceed.  All questions were answered.  Sterile technique used throughout procedure and epidural site dressed with sterile barrier dressing. No paresthesia or other complications noted. The patient did not experience any signs of intravascular injection such as tinnitus or metallic taste in mouth nor signs of intrathecal spread such as rapid motor block. Please see nursing notes for vital signs. Reason for block:procedure for pain

## 2017-01-20 NOTE — H&P (Signed)
31 y.o. 1736w1d  G2P1001 comes in for elective induction at term.  Otherwise has good fetal movement and no bleeding.  Past Medical History:  Diagnosis Date  . Medical history non-contributory     Past Surgical History:  Procedure Laterality Date  . OVARIAN CYST SURGERY Right 2003    OB History  Gravida Para Term Preterm AB Living  2 1 1  0 0 1  SAB TAB Ectopic Multiple Live Births  0 0 0 0 1    # Outcome Date GA Lbr Len/2nd Weight Sex Delivery Anes PTL Lv  2 Current           1 Term 09/22/13 9076w0d 07:18 / 00:49 6 lb 14.8 oz (3.141 kg) F Vag-Vacuum EPI  LIV      Social History   Socioeconomic History  . Marital status: Married    Spouse name: Not on file  . Number of children: Not on file  . Years of education: Not on file  . Highest education level: Not on file  Social Needs  . Financial resource strain: Not on file  . Food insecurity - worry: Not on file  . Food insecurity - inability: Not on file  . Transportation needs - medical: Not on file  . Transportation needs - non-medical: Not on file  Occupational History  . Not on file  Tobacco Use  . Smoking status: Never Smoker  . Smokeless tobacco: Never Used  Substance and Sexual Activity  . Alcohol use: No  . Drug use: No  . Sexual activity: Yes    Birth control/protection: None  Other Topics Concern  . Not on file  Social History Narrative  . Not on file   Patient has no known allergies.    Prenatal Transfer Tool  Maternal Diabetes: No Genetic Screening: Declined Maternal Ultrasounds/Referrals: Normal Fetal Ultrasounds or other Referrals:  None Maternal Substance Abuse:  No Significant Maternal Medications:  None Significant Maternal Lab Results: None  Other PNC: uncomplicated.    There were no vitals filed for this visit.   Lungs/Cor:  NAD Abdomen:  soft, gravid Ex:  no cords, erythema SVE:  2-3/80/-2, AROM clear FHTs:  130s, good STV, NST R Toco:  q occ   A/P   Term induction.   GBS  neg.  Shareta Fishbaugh A

## 2017-01-20 NOTE — Anesthesia Preprocedure Evaluation (Signed)
Anesthesia Evaluation  Patient identified by MRN, date of birth, ID band Patient awake    Reviewed: Allergy & Precautions, NPO status , Patient's Chart, lab work & pertinent test results  Airway Mallampati: II  TM Distance: >3 FB Neck ROM: Full    Dental  (+) Teeth Intact, Dental Advisory Given   Pulmonary neg pulmonary ROS,    Pulmonary exam normal breath sounds clear to auscultation       Cardiovascular negative cardio ROS Normal cardiovascular exam Rhythm:Regular Rate:Normal     Neuro/Psych negative neurological ROS  negative psych ROS   GI/Hepatic negative GI ROS, Neg liver ROS,   Endo/Other  negative endocrine ROS  Renal/GU negative Renal ROS     Musculoskeletal Scoliosis    Abdominal   Peds  Hematology  (+) Blood dyscrasia, anemia , Plt 192k   Anesthesia Other Findings Day of surgery medications reviewed with the patient.  Reproductive/Obstetrics (+) Pregnancy                             Anesthesia Physical Anesthesia Plan  ASA: II  Anesthesia Plan: Epidural   Post-op Pain Management:    Induction:   PONV Risk Score and Plan: 2 and Treatment may vary due to age or medical condition  Airway Management Planned:   Additional Equipment:   Intra-op Plan:   Post-operative Plan:   Informed Consent: I have reviewed the patients History and Physical, chart, labs and discussed the procedure including the risks, benefits and alternatives for the proposed anesthesia with the patient or authorized representative who has indicated his/her understanding and acceptance.   Dental advisory given  Plan Discussed with:   Anesthesia Plan Comments: (Patient identified. Risks/Benefits/Options discussed with patient including but not limited to bleeding, infection, nerve damage, paralysis, failed block, incomplete pain control, headache, blood pressure changes, nausea, vomiting, reactions  to medication both or allergic, itching and postpartum back pain. Confirmed with bedside nurse the patient's most recent platelet count. Confirmed with patient that they are not currently taking any anticoagulation, have any bleeding history or any family history of bleeding disorders. Patient expressed understanding and wished to proceed. All questions were answered. )        Anesthesia Quick Evaluation

## 2017-01-20 NOTE — Progress Notes (Signed)
Vitals:   01/20/17 1244 01/20/17 1301 01/20/17 1331 01/20/17 1401  BP:  122/65 129/70 136/75  Pulse:  67 71 73  Resp:  16  18  Temp: 97.6 F (36.4 C)     TempSrc: Oral     SpO2:      Weight:      Height:        Results for orders placed or performed during the hospital encounter of 01/20/17 (from the past 24 hour(s))  CBC     Status: Abnormal   Collection Time: 01/20/17  7:48 AM  Result Value Ref Range   WBC 8.9 4.0 - 10.5 K/uL   RBC 3.63 (L) 3.87 - 5.11 MIL/uL   Hemoglobin 10.9 (L) 12.0 - 15.0 g/dL   HCT 16.133.3 (L) 09.636.0 - 04.546.0 %   MCV 91.7 78.0 - 100.0 fL   MCH 30.0 26.0 - 34.0 pg   MCHC 32.7 30.0 - 36.0 g/dL   RDW 40.913.2 81.111.5 - 91.415.5 %   Platelets 192 150 - 400 K/uL  RPR     Status: None   Collection Time: 01/20/17  7:48 AM  Result Value Ref Range   RPR Ser Ql Non Reactive Non Reactive  Type and screen     Status: None (Preliminary result)   Collection Time: 01/20/17  7:48 AM  Result Value Ref Range   ABO/RH(D) O POS    Antibody Screen NEG    Sample Expiration 01/23/2017    Unit Number [id number]    Blood Component Type RED CELLS,LR    Unit division 00    Status of Unit ALLOCATED    Transfusion Status OK TO TRANSFUSE    Crossmatch Result Compatible    Unit Number [id number]    Blood Component Type RED CELLS,LR    Unit division 00    Status of Unit ALLOCATED    Transfusion Status OK TO TRANSFUSE    Crossmatch Result Compatible   Comprehensive metabolic panel     Status: Abnormal   Collection Time: 01/20/17  7:48 AM  Result Value Ref Range   Sodium 137 135 - 145 mmol/L   Potassium 4.3 3.5 - 5.1 mmol/L   Chloride 106 101 - 111 mmol/L   CO2 23 22 - 32 mmol/L   Glucose, Bld 74 65 - 99 mg/dL   BUN 11 6 - 20 mg/dL   Creatinine, Ser 3.240.64 0.44 - 1.00 mg/dL   Calcium 8.7 (L) 8.9 - 10.3 mg/dL   Total Protein 6.2 (L) 6.5 - 8.1 g/dL   Albumin 2.9 (L) 3.5 - 5.0 g/dL   AST 27 15 - 41 U/L   ALT 15 14 - 54 U/L   Alkaline Phosphatase 193 (H) 38 - 126 U/L   Total  Bilirubin 0.5 0.3 - 1.2 mg/dL   GFR calc non Af Amer >60 >60 mL/min   GFR calc Af Amer >60 >60 mL/min   Anion gap 8 5 - 15  Uric acid     Status: None   Collection Time: 01/20/17  7:48 AM  Result Value Ref Range   Uric Acid, Serum 6.4 2.3 - 6.6 mg/dL  Protein / creatinine ratio, urine     Status: Abnormal   Collection Time: 01/20/17 10:28 AM  Result Value Ref Range   Creatinine, Urine 101.00 mg/dL   Total Protein, Urine 21 mg/dL   Protein Creatinine Ratio 0.21 (H) 0.00 - 0.15 mg/mg[Cre]  Initiate postpartum hemorrage protocol     Status: None   Collection Time:  01/20/17  3:12 PM  Result Value Ref Range   Initiate PPH protocol (BB notified)      PPH ORDER RECEIVED VERBAL ORDER CONFIRMED MOYER,S AT 1512 ON 01/20/17  CBC     Status: Abnormal   Collection Time: 01/20/17  3:27 PM  Result Value Ref Range   WBC 17.3 (H) 4.0 - 10.5 K/uL   RBC 3.76 (L) 3.87 - 5.11 MIL/uL   Hemoglobin 11.2 (L) 12.0 - 15.0 g/dL   HCT 78.234.5 (L) 95.636.0 - 21.346.0 %   MCV 91.8 78.0 - 100.0 fL   MCH 29.8 26.0 - 34.0 pg   MCHC 32.5 30.0 - 36.0 g/dL   RDW 08.613.3 57.811.5 - 46.915.5 %   Platelets 232 150 - 400 K/uL  Prepare RBC     Status: None   Collection Time: 01/20/17  3:30 PM  Result Value Ref Range   Order Confirmation ORDER PROCESSED BY BLOOD BANK     BPs elevated during labor by no s/s of preeclampsia.  Will get CBC in am if bleeding is stopped or continues to be very slow.  Fall precautions.

## 2017-01-21 ENCOUNTER — Other Ambulatory Visit: Payer: Self-pay

## 2017-01-21 LAB — CBC
HCT: 31 % — ABNORMAL LOW (ref 36.0–46.0)
HEMOGLOBIN: 10.2 g/dL — AB (ref 12.0–15.0)
MCH: 29.7 pg (ref 26.0–34.0)
MCHC: 32.9 g/dL (ref 30.0–36.0)
MCV: 90.1 fL (ref 78.0–100.0)
Platelets: 151 10*3/uL (ref 150–400)
RBC: 3.44 MIL/uL — ABNORMAL LOW (ref 3.87–5.11)
RDW: 13.2 % (ref 11.5–15.5)
WBC: 12.6 10*3/uL — ABNORMAL HIGH (ref 4.0–10.5)

## 2017-01-21 NOTE — Progress Notes (Signed)
100 ml removed from bakri balloon.  Total removed.

## 2017-01-21 NOTE — Progress Notes (Signed)
100 ml fluid withdrawn from bakri balloon.  Total now .

## 2017-01-21 NOTE — Progress Notes (Signed)
Patient is doing well. Has not been out of bed, but denies dizziness, chest pain, SOB.  Pain control is good.   There has been minimal output from Bakri balloon overnight (approximately 40cc)  Was hypertensive during delivery--preE labs were unremarkable.  BPs in last 12 hrs have been 140/80-90s.    Vitals:   01/20/17 2300 01/21/17 0030 01/21/17 0403 01/21/17 0813  BP:  (!) 141/88 (!) 141/87 (!) 145/95  Pulse:  60 (!) 57 68  Resp:    18  Temp: 99.1 F (37.3 C) 98.1 F (36.7 C) 98.1 F (36.7 C) 98.7 F (37.1 C)  TempSrc: Oral Oral Oral Oral  SpO2:  100% 100% 99%  Weight:      Height:      Uop > 100cc/hr, clear  NAD Fundus firm and at the umbilicus.  No bleeding on peripad.   Ext: no edema  Lab Results  Component Value Date   WBC 12.6 (H) 01/21/2017   HGB 10.2 (L) 01/21/2017   HCT 31.0 (L) 01/21/2017   MCV 90.1 01/21/2017   PLT 151 01/21/2017    --/--/O POS (12/13 0748)/RImmune  A/P 31 y.o. G2P2002 PPD#1 s/p TSVD c/b PPH with bakri balloon placement Hemoglobin s/p PPH decreased from 10.9 to 10.2.  Will slowly defalte bakri for removal at this time 100mL removed.  Will remove 100mL q1hr until deflated.  Will leave foley catheter in place GHTN--BPs mild range--will monitor closely today and with increased activity.    [facility] Pennington [employer]

## 2017-01-21 NOTE — Progress Notes (Signed)
Bakri deflated entirely and removed.   Fundus is firm and below the umbilicus.  Fundal massage performed with no clots or bleeding from vagina noted.   Will remove foley catheter now.  Ambulate patient this afternoon and monitor bleeding closely

## 2017-01-21 NOTE — Progress Notes (Signed)
100 ml fluid from bakri balloon removed.  Total .

## 2017-01-21 NOTE — Anesthesia Postprocedure Evaluation (Signed)
Anesthesia Post Note  Patient: Rhonda Pennington  Procedure(s) Performed: AN AD HOC LABOR EPIDURAL     Patient location during evaluation: Mother Baby Anesthesia Type: Epidural Level of consciousness: awake and alert, oriented and patient cooperative Pain management: pain level controlled Vital Signs Assessment: post-procedure vital signs reviewed and stable Respiratory status: spontaneous breathing Cardiovascular status: stable Postop Assessment: no headache, epidural receding, patient able to bend at knees and no signs of nausea or vomiting Anesthetic complications: no Comments: Pain Score 2.    Last Vitals:  Vitals:   01/21/17 0030 01/21/17 0403  BP: (!) 141/88 (!) 141/87  Pulse: 60 (!) 57  Resp:    Temp: 36.7 C 36.7 C  SpO2: 100% 100%    Last Pain:  Vitals:   01/21/17 0403  TempSrc: Oral  PainSc:    Pain Goal: Patients Stated Pain Goal: 3 (01/21/17 0400)               Merrilyn PumaWRINKLE,Altie Savard

## 2017-01-21 NOTE — Progress Notes (Signed)
Dr  Chestine Sporelark withdrew 100ml from bakri balloon.

## 2017-01-22 MED ORDER — IBUPROFEN 800 MG PO TABS: 800.0000 mg | ORAL_TABLET | Freq: Four times a day (QID) | ORAL | 0 refills | Status: DC | PRN

## 2017-01-22 NOTE — Progress Notes (Signed)
Discharge instructions on postpartum care given to pt. Pt verbalizes understanding and has no questions or concerns at this time. IV taken out and pt tolerated well. Pt walked out in stable condition.

## 2017-01-22 NOTE — Progress Notes (Signed)
Patient is doing well. Bleeding has been scant.  Pain is controlled.  She is voiding and ambulating without difficulty. Over last 12 hours, BPs have been 119-131/75-88.  She denies ha/bv/ruq pain  Vitals:   01/21/17 2004 01/22/17 0006 01/22/17 0455 01/22/17 0800  BP: 126/86 128/85 119/75 131/88  Pulse: 73 65 70 60  Resp: 17 16 16 18   Temp: 98.3 F (36.8 C) 98 F (36.7 C) 97.9 F (36.6 C) 98.8 F (37.1 C)  TempSrc: Oral Oral Oral Oral  SpO2: 100% 100% 99% 99%  Weight:      Height:        NAD Fundus firm and below umbilicus.  No bleeding on peripad.   Ext: no edema  Lab Results  Component Value Date   WBC 12.6 (H) 01/21/2017   HGB 10.2 (L) 01/21/2017   HCT 31.0 (L) 01/21/2017   MCV 90.1 01/21/2017   PLT 151 01/21/2017    --/--/O POS (12/13 0748)/RImmune  A/P 31 y.o. G2P2002 PPD#2 s/p TSVD c/b PPH and GHTN Bleeding minimal since removal of Bakri balloon.  hgb stable >10 Will ambulate more today and trend BPs.  If they remain mild range with activity, will d/c to home later this afternoon.    [facility] Springfield[facility]ANNA GEFFEL [employer]

## 2017-01-22 NOTE — Discharge Summary (Signed)
Obstetric Discharge Summary Reason for Admission: induction of labor Prenatal Procedures: none Intrapartum Procedures: spontaneous vaginal delivery Postpartum Procedures: none Complications-Operative and Postpartum: labial laceration and hemorrhage requiring bakri balloon Hemoglobin  Date Value Ref Range Status  01/21/2017 10.2 (L) 12.0 - 15.0 g/dL Final   HCT  Date Value Ref Range Status  01/21/2017 31.0 (L) 36.0 - 46.0 % Final    Physical Exam:  General: alert, cooperative and appears stated age 61Lochia: appropriate Uterine Fundus: firm DVT Evaluation: No evidence of DVT seen on physical exam.  Discharge Diagnoses: Term Pregnancy-delivered  Discharge Information: Date: 01/22/2017 Activity: pelvic rest Diet: routine Medications: PNV, Ibuprofen and Iron Condition: stable Instructions: refer to practice specific booklet Discharge to: home Follow-up Information    Carrington ClampHorvath, Michelle, MD Follow up in 4 day(s).   Specialty:  Obstetrics and Gynecology Why:  BP check Contact information: [address] [place] [id number] [phone]           Newborn Data: Live born female  Birth Weight: 7 lb 8.3 oz (3410 g) APGAR: 8, 9  Newborn Delivery   Birth date/time:  01/20/2017 15:02:00 Delivery type:  Vaginal, Spontaneous     Home with mother.  [facility] GEFFEL Shad Ledvina 01/22/2017, 2:37 PM

## 2017-01-23 LAB — TYPE AND SCREEN
ABO/RH(D): O POS
ANTIBODY SCREEN: NEGATIVE
UNIT DIVISION: 0
UNIT DIVISION: 0

## 2017-01-23 LAB — BPAM RBC
BLOOD PRODUCT EXPIRATION DATE: 201901082359
BLOOD PRODUCT EXPIRATION DATE: 201901082359
UNIT TYPE AND RH: 5100
Unit Type and Rh: 5100

## 2017-01-26 ENCOUNTER — Inpatient Hospital Stay (HOSPITAL_COMMUNITY)
Admission: AD | Admit: 2017-01-26 | Payer: BLUE CROSS/BLUE SHIELD | Source: Ambulatory Visit | Admitting: Obstetrics and Gynecology

## 2017-01-29 ENCOUNTER — Inpatient Hospital Stay (HOSPITAL_COMMUNITY)
Admission: AD | Admit: 2017-01-29 | Discharge: 2017-01-29 | Disposition: A | Discharge status: Home / Self Care | Payer: Self-pay | Source: Ambulatory Visit | Attending: Obstetrics and Gynecology | Admitting: Obstetrics and Gynecology

## 2017-01-29 ENCOUNTER — Encounter (HOSPITAL_COMMUNITY): Payer: Self-pay

## 2017-01-29 DIAGNOSIS — O135 Gestational [pregnancy-induced] hypertension without significant proteinuria, complicating the puerperium: Secondary | ICD-10-CM | POA: Insufficient documentation

## 2017-01-29 DIAGNOSIS — O165 Unspecified maternal hypertension, complicating the puerperium: Secondary | ICD-10-CM

## 2017-01-29 LAB — COMPREHENSIVE METABOLIC PANEL
ALBUMIN: 3.6 g/dL (ref 3.5–5.0)
ALT: 20 U/L (ref 14–54)
AST: 22 U/L (ref 15–41)
Alkaline Phosphatase: 158 U/L — ABNORMAL HIGH (ref 38–126)
Anion gap: 8 (ref 5–15)
BUN: 10 mg/dL (ref 6–20)
CHLORIDE: 107 mmol/L (ref 101–111)
CO2: 24 mmol/L (ref 22–32)
CREATININE: 0.68 mg/dL (ref 0.44–1.00)
Calcium: 9 mg/dL (ref 8.9–10.3)
GFR calc Af Amer: >60 mL/min (ref >60)
GFR calc non Af Amer: >60 mL/min (ref >60)
GLUCOSE: 96 mg/dL (ref 65–99)
Potassium: 3.9 mmol/L (ref 3.5–5.1)
SODIUM: 139 mmol/L (ref 135–145)
Total Bilirubin: 0.4 mg/dL (ref 0.3–1.2)
Total Protein: 6.9 g/dL (ref 6.5–8.1)

## 2017-01-29 LAB — URINALYSIS, ROUTINE W REFLEX MICROSCOPIC
Bilirubin Urine: NEGATIVE
GLUCOSE, UA: NEGATIVE mg/dL
KETONES UR: NEGATIVE mg/dL
Nitrite: NEGATIVE
PH: 6 (ref 5.0–8.0)
PROTEIN: NEGATIVE mg/dL
Specific Gravity, Urine: 1.004 — ABNORMAL LOW (ref 1.005–1.030)

## 2017-01-29 LAB — CBC
HCT: 35.3 % — ABNORMAL LOW (ref 36.0–46.0)
Hemoglobin: 11.8 g/dL — ABNORMAL LOW (ref 12.0–15.0)
MCH: 29.6 pg (ref 26.0–34.0)
MCHC: 33.4 g/dL (ref 30.0–36.0)
MCV: 88.5 fL (ref 78.0–100.0)
PLATELETS: 287 10*3/uL (ref 150–400)
RBC: 3.99 MIL/uL (ref 3.87–5.11)
RDW: 13.4 % (ref 11.5–15.5)
WBC: 8.7 10*3/uL (ref 4.0–10.5)

## 2017-01-29 LAB — PROTEIN / CREATININE RATIO, URINE
Creatinine, Urine: 37 mg/dL
PROTEIN CREATININE RATIO: 0.24 mg/mg{creat} — AB (ref 0.00–0.15)
TOTAL PROTEIN, URINE: 9 mg/dL

## 2017-01-29 LAB — LACTATE DEHYDROGENASE: LDH: 197 U/L — ABNORMAL HIGH (ref 98–192)

## 2017-01-29 LAB — URIC ACID: Uric Acid, Serum: 5 mg/dL (ref 2.3–6.6)

## 2017-01-29 NOTE — MAU Provider Note (Signed)
Chief Complaint: Hypertension   First Provider Initiated Contact with Patient 01/29/17 1832      SUBJECTIVE HPI: Rhonda Pennington is a 31 y.o. G2P2002 on PPD#9 following NSVD who presents to maternity admissions reporting blurred vision and chest tightness starting today. She was an elective IOL at 39 weeks with no HTN in pregnancy before hospital admission. During her hospital admission she had some elevated BP and had normal labwork. She is not on any medications for HTN. She has no hx of HTN outside of pregnancy. She was seen in the office 2 days ago and had borderline BPs.  Today, she feels like she overdid things and has been out and about all day then started having the vision changes and chest tightness.  Her symptoms are improved but not resolved while resting in MAU.  She has not tried any treatments. There are no other associated symptoms.  She denies h/a or epigastric pain.  Her postpartum course has been uncomplicated.   HPI  Past Medical History:  Diagnosis Date  . Medical history non-contributory    Past Surgical History:  Procedure Laterality Date  . OVARIAN CYST SURGERY Right 2003   Social History   Socioeconomic History  . Marital status: Married    Spouse name: Not on file  . Number of children: Not on file  . Years of education: Not on file  . Highest education level: Not on file  Social Needs  . Financial resource strain: Not on file  . Food insecurity - worry: Not on file  . Food insecurity - inability: Not on file  . Transportation needs - medical: Not on file  . Transportation needs - non-medical: Not on file  Occupational History  . Not on file  Tobacco Use  . Smoking status: Never Smoker  . Smokeless tobacco: Never Used  Substance and Sexual Activity  . Alcohol use: No  . Drug use: No  . Sexual activity: Yes    Birth control/protection: None  Other Topics Concern  . Not on file  Social History Narrative  . Not on file   No current  facility-administered medications on file prior to encounter.    Current Outpatient Medications on File Prior to Encounter  Medication Sig Dispense Refill  . omeprazole (PRILOSEC) 20 MG capsule Take 20 mg by mouth daily as needed (heartburn).    . Prenatal Vit-Fe Fumarate-FA (PRENATAL MULTIVITAMIN) TABS tablet Take 1 tablet by mouth daily.     No Known Allergies  ROS:  Review of Systems  Constitutional: Negative for chills, fatigue and fever.  Eyes: Positive for visual disturbance.  Respiratory: Positive for chest tightness. Negative for shortness of breath.   Cardiovascular: Negative for chest pain.  Gastrointestinal: Negative for abdominal pain, nausea and vomiting.  Genitourinary: Negative for difficulty urinating, dysuria, flank pain, pelvic pain, vaginal bleeding, vaginal discharge and vaginal pain.  Neurological: Negative for dizziness and headaches.  Psychiatric/Behavioral: Negative.      I have reviewed patient's Past Medical Hx, Surgical Hx, Family Hx, Social Hx, medications and allergies.   Physical Exam   Patient Vitals for the past 24 hrs:  BP Temp Temp src Pulse Resp  01/29/17 1916 (!) 144/97 - - 75 -  01/29/17 1900 (!) 147/96 - - 76 -  01/29/17 1845 (!) 143/98 - - 85 -  01/29/17 1830 (!) 144/95 98.4 F (36.9 C) Oral 100 18   Constitutional: Well-developed, well-nourished female in no acute distress.  HEART: normal rate, heart sounds, regular rhythm RESP:  normal effort, lung sounds clear and equal bilaterally GI: Abd soft, non-tender. Pos BS x 4 MS: Extremities nontender, no edema, normal ROM Neurologic: Alert and oriented x 4.  GU: Neg CVAT.  PELVIC EXAM: Deferred   LAB RESULTS Results for orders placed or performed during the hospital encounter of 01/29/17 (from the past 24 hour(s))  Protein / creatinine ratio, urine     Status: Abnormal   Collection Time: 01/29/17  6:20 PM  Result Value Ref Range   Creatinine, Urine 37.00 mg/dL   Total Protein, Urine  9 mg/dL   Protein Creatinine Ratio 0.24 (H) 0.00 - 0.15 mg/mg[Cre]  Urinalysis, Routine w reflex microscopic     Status: Abnormal   Collection Time: 01/29/17  6:20 PM  Result Value Ref Range   Color, Urine STRAW (A) YELLOW   APPearance CLEAR CLEAR   Specific Gravity, Urine 1.004 (L) 1.005 - 1.030   pH 6.0 5.0 - 8.0   Glucose, UA NEGATIVE NEGATIVE mg/dL   Hgb urine dipstick LARGE (A) NEGATIVE   Bilirubin Urine NEGATIVE NEGATIVE   Ketones, ur NEGATIVE NEGATIVE mg/dL   Protein, ur NEGATIVE NEGATIVE mg/dL   Nitrite NEGATIVE NEGATIVE   Leukocytes, UA MODERATE (A) NEGATIVE   RBC / HPF 0-5 0 - 5 RBC/hpf   WBC, UA TOO NUMEROUS TO COUNT 0 - 5 WBC/hpf   Bacteria, UA RARE (A) NONE SEEN   Squamous Epithelial / LPF 0-5 (A) NONE SEEN  CBC     Status: Abnormal   Collection Time: 01/29/17  6:47 PM  Result Value Ref Range   WBC 8.7 4.0 - 10.5 K/uL   RBC 3.99 3.87 - 5.11 MIL/uL   Hemoglobin 11.8 (L) 12.0 - 15.0 g/dL   HCT 04.535.3 (L) 40.936.0 - 81.146.0 %   MCV 88.5 78.0 - 100.0 fL   MCH 29.6 26.0 - 34.0 pg   MCHC 33.4 30.0 - 36.0 g/dL   RDW 91.413.4 78.211.5 - 95.615.5 %   Platelets 287 150 - 400 K/uL  Comprehensive metabolic panel     Status: Abnormal   Collection Time: 01/29/17  6:47 PM  Result Value Ref Range   Sodium 139 135 - 145 mmol/L   Potassium 3.9 3.5 - 5.1 mmol/L   Chloride 107 101 - 111 mmol/L   CO2 24 22 - 32 mmol/L   Glucose, Bld 96 65 - 99 mg/dL   BUN 10 6 - 20 mg/dL   Creatinine, Ser 2.130.68 0.44 - 1.00 mg/dL   Calcium 9.0 8.9 - 08.610.3 mg/dL   Total Protein 6.9 6.5 - 8.1 g/dL   Albumin 3.6 3.5 - 5.0 g/dL   AST 22 15 - 41 U/L   ALT 20 14 - 54 U/L   Alkaline Phosphatase 158 (H) 38 - 126 U/L   Total Bilirubin 0.4 0.3 - 1.2 mg/dL   GFR calc non Af Amer >60 >60 mL/min   GFR calc Af Amer >60 >60 mL/min   Anion gap 8 5 - 15  Uric acid     Status: None   Collection Time: 01/29/17  6:47 PM  Result Value Ref Range   Uric Acid, Serum 5.0 2.3 - 6.6 mg/dL  Lactate dehydrogenase     Status: Abnormal    Collection Time: 01/29/17  6:47 PM  Result Value Ref Range   LDH 197 (H) 98 - 192 U/L    --/--/O POS (12/13 0748)  IMAGING No results found.  MAU Management/MDM: Ordered labs and reviewed results.   CBC, CMP,  U/A, P/C ratio, LDH, Uric Acid Labs wnl with slightly elevated LDH, liver enzymes and platelets wnl.  P/C ratio normal at 0.24 even with large hemoglobin from lochia in UA Consult Dr Henderson Cloud with assessment and findings. No evidence of preeclampsia today, pt symptoms completely resolved with rest. F/U in office next week for BP check Return to MAU as needed for worsening symptoms or emergencies. Pt discharged with strict preeclampsia precautions.  ASSESSMENT 1. Postpartum hypertension     PLAN Discharge home Allergies as of 01/29/2017   No Known Allergies     Medication List    STOP taking these medications   ibuprofen 800 MG tablet Commonly known as:  ADVIL,MOTRIN     TAKE these medications   omeprazole 20 MG capsule Commonly known as:  PRILOSEC Take 20 mg by mouth daily as needed (heartburn).   prenatal multivitamin Tabs tablet Take 1 tablet by mouth daily.      Follow-up Information    Carrington Clamp, MD Follow up.   Specialty:  Obstetrics and Gynecology Why:  On 12/27 or 12/28 for BP check. Return to MAU as needed for emergencies. Contact information: [address] Dorothyann Gibbs [place] [address] [phone]           Sharen Counter Certified Nurse-Midwife 01/29/2017  7:39 PM

## 2017-01-29 NOTE — Progress Notes (Addendum)
G2P2. Presents to triage for high bp pp. Pt delivered on Dec 13. Denies ha. Vision change and slight pressure on chest. Denies Pain.    1837: provider at bs assessing. Labs ordered.   1846: phlebotomist at bs  1934: provider at bs reassessing and discussing POC and discharge instructions.   D/C instructions given with pt understanding to follow up with OB provider. Pt verbalizes understanding. Pt left unit via ambulatory with family.

## 2019-02-14 DIAGNOSIS — Z01419 Encounter for gynecological examination (general) (routine) without abnormal findings: Secondary | ICD-10-CM | POA: Diagnosis not present

## 2019-02-14 DIAGNOSIS — Z8742 Personal history of other diseases of the female genital tract: Secondary | ICD-10-CM | POA: Diagnosis not present

## 2019-02-14 DIAGNOSIS — J302 Other seasonal allergic rhinitis: Secondary | ICD-10-CM | POA: Diagnosis not present

## 2019-02-14 DIAGNOSIS — H6121 Impacted cerumen, right ear: Secondary | ICD-10-CM | POA: Diagnosis not present

## 2019-02-14 DIAGNOSIS — J392 Other diseases of pharynx: Secondary | ICD-10-CM | POA: Diagnosis not present

## 2019-02-14 DIAGNOSIS — Z6822 Body mass index (BMI) 22.0-22.9, adult: Secondary | ICD-10-CM | POA: Diagnosis not present

## 2019-02-14 DIAGNOSIS — K141 Geographic tongue: Secondary | ICD-10-CM | POA: Diagnosis not present

## 2019-02-14 DIAGNOSIS — Z124 Encounter for screening for malignant neoplasm of cervix: Secondary | ICD-10-CM | POA: Diagnosis not present

## 2019-02-24 DIAGNOSIS — J029 Acute pharyngitis, unspecified: Secondary | ICD-10-CM | POA: Diagnosis not present

## 2019-02-28 DIAGNOSIS — K429 Umbilical hernia without obstruction or gangrene: Secondary | ICD-10-CM | POA: Diagnosis not present

## 2019-03-02 DIAGNOSIS — D2261 Melanocytic nevi of right upper limb, including shoulder: Secondary | ICD-10-CM | POA: Diagnosis not present

## 2019-03-02 DIAGNOSIS — D224 Melanocytic nevi of scalp and neck: Secondary | ICD-10-CM | POA: Diagnosis not present

## 2019-03-02 DIAGNOSIS — D225 Melanocytic nevi of trunk: Secondary | ICD-10-CM | POA: Diagnosis not present

## 2019-03-02 DIAGNOSIS — D2262 Melanocytic nevi of left upper limb, including shoulder: Secondary | ICD-10-CM | POA: Diagnosis not present

## 2019-03-19 ENCOUNTER — Other Ambulatory Visit: Payer: Self-pay

## 2019-03-19 ENCOUNTER — Ambulatory Visit: Payer: BC Managed Care – PPO | Admitting: Allergy

## 2019-03-19 ENCOUNTER — Encounter: Payer: Self-pay | Admitting: Allergy

## 2019-03-19 ENCOUNTER — Other Ambulatory Visit: Payer: Self-pay | Admitting: Allergy

## 2019-03-19 VITALS — BP 112/76 | HR 103 | Temp 98.3°F | Resp 18 | Ht 66.0 in | Wt 145.8 lb

## 2019-03-19 DIAGNOSIS — R12 Heartburn: Secondary | ICD-10-CM | POA: Diagnosis not present

## 2019-03-19 DIAGNOSIS — H1013 Acute atopic conjunctivitis, bilateral: Secondary | ICD-10-CM | POA: Diagnosis not present

## 2019-03-19 DIAGNOSIS — J3089 Other allergic rhinitis: Secondary | ICD-10-CM | POA: Diagnosis not present

## 2019-03-19 MED ORDER — PAZEO 0.7 % OP SOLN: 1.0000 [drp] | Freq: Every day | OPHTHALMIC | 5 refills | Status: DC | PRN

## 2019-03-19 MED ORDER — MONTELUKAST SODIUM 10 MG PO TABS: 10.0000 mg | ORAL_TABLET | Freq: Every day | ORAL | 5 refills | Status: AC

## 2019-03-19 NOTE — Assessment & Plan Note (Signed)
Perennial rhinoconjunctivitis symptoms for the past 4 to 5 years with worsening spring and fall.  Currently on Zyrtec and Flonase with some benefit.  Patient was seen by ENT 2 months ago.  History of reflux and taking Nexium as needed.  Today's skin testing showed:  Positive to grass, weed, ragweed, trees, molds, dust mites, cockroaches and cats. Results given.   Start environmental control measures.  Start Singulair (montelukast) 10mg  daily at night. Cautioned that in some children/adults can experience behavioral changes including hyperactivity, agitation, depression, sleep disturbances and suicidal ideations. These side effects are rare, but if you notice them you should notify me and discontinue Singulair (montelukast).  May use over the counter antihistamines such as Zyrtec (cetirizine), Claritin (loratadine), Allegra (fexofenadine), or Xyzal (levocetirizine) daily as needed. May take twice a day if needed during allergy flares.   Continue Flonase 1 spray per nostril 1-2 times a day as needed for nasal symptoms.  Nasal saline spray (i.e., Simply Saline) or nasal saline lavage (i.e., NeilMed) is recommended as needed and prior to medicated nasal sprays.  May use Pazeo 1 drop in each eye daily as needed for itchy/watery eyes.   Had a detailed discussion with patient/family that clinical history is suggestive of allergic rhinitis, and may benefit from allergy immunotherapy (AIT). Discussed in detail regarding the dosing, schedule, side effects (mild to moderate local allergic reaction and rarely systemic allergic reactions including anaphylaxis), and benefits (significant improvement in nasal symptoms, seasonal flares of asthma) of immunotherapy with the patient. There is significant time commitment involved with allergy shots, which includes weekly immunotherapy injections for first 9-12 months and then biweekly to monthly injections for 3-5 years.   Read about allergy injections.

## 2019-03-19 NOTE — Patient Instructions (Addendum)
Today's skin testing showed:  Positive to grass, weed, ragweed, trees, molds, dust mites, cockroaches and cats.  Results given.    Start environmental control measures.  Start Singulair (montelukast) 10mg  daily at night. Cautioned that in some children/adults can experience behavioral changes including hyperactivity, agitation, depression, sleep disturbances and suicidal ideations. These side effects are rare, but if you notice them you should notify me and discontinue Singulair (montelukast).  May use over the counter antihistamines such as Zyrtec (cetirizine), Claritin (loratadine), Allegra (fexofenadine), or Xyzal (levocetirizine) daily as needed. May take twice a day if needed during allergy flares.   Continue Flonase 1 spray per nostril 1-2 times a day as needed for nasal symptoms.  Nasal saline spray (i.e., Simply Saline) or nasal saline lavage (i.e., NeilMed) is recommended as needed and prior to medicated nasal sprays.  May use Pazeo 1 drop in each eye daily as needed for itchy/watery eyes.   Had a detailed discussion with patient/family that clinical history is suggestive of allergic rhinitis, and may benefit from allergy immunotherapy (AIT). Discussed in detail regarding the dosing, schedule, side effects (mild to moderate local allergic reaction and rarely systemic allergic reactions including anaphylaxis), and benefits (significant improvement in nasal symptoms, seasonal flares of asthma) of immunotherapy with the patient. There is significant time commitment involved with allergy shots, which includes weekly immunotherapy injections for first 9-12 months and then biweekly to monthly injections for 3-5 years.   Read about allergy injections.   Follow up in 3 months or sooner if needed.   Reducing Pollen Exposure . Pollen seasons: trees (spring), grass (summer) and ragweed/weeds (fall). 06-17-1978 Keep windows closed in your home and car to lower pollen exposure.  Marland Kitchen air  conditioning in the bedroom and throughout the house if possible.  . Avoid going out in dry windy days - especially early morning. . Pollen counts are highest between 5 - 10 AM and on dry, hot and windy days.  . Save outside activities for late afternoon or after a heavy rain, when pollen levels are lower.  . Avoid mowing of grass if you have grass pollen allergy. Lilian Kapur Be aware that pollen can also be transported indoors on people and pets.  . Dry your clothes in an automatic dryer rather than hanging them outside where they might collect pollen.  . Rinse hair and eyes before bedtime. Mold Control . Mold and fungi can grow on a variety of surfaces provided certain temperature and moisture conditions exist.  . Outdoor molds grow on plants, decaying vegetation and soil. The major outdoor mold, Alternaria and Cladosporium, are found in very high numbers during hot and dry conditions. Generally, a late summer - fall peak is seen for common outdoor fungal spores. Rain will temporarily lower outdoor mold spore count, but counts rise rapidly when the rainy period ends. . The most important indoor molds are Aspergillus and Penicillium. Dark, humid and poorly ventilated basements are ideal sites for mold growth. The next most common sites of mold growth are the bathroom and the kitchen. Outdoor (Seasonal) Mold Control . Use air conditioning and keep windows closed. . Avoid exposure to decaying vegetation. 12-05-1973 Avoid leaf raking. . Avoid grain handling. . Consider wearing a face mask if working in moldy areas.  Indoor (Perennial) Mold Control  . Maintain humidity below 50%. . Get rid of mold growth on hard surfaces with water, detergent and, if necessary, 5% bleach (do not mix with other cleaners). Then dry the area completely. If mold  covers an area more than 10 square feet, consider hiring an indoor environmental professional. . For clothing, washing with soap and water is best. If moldy items cannot be  cleaned and dried, throw them away. . Remove sources e.g. contaminated carpets. . Repair and seal leaking roofs or pipes. Using dehumidifiers in damp basements may be helpful, but empty the water and clean units regularly to prevent mildew from forming. All rooms, especially basements, bathrooms and kitchens, require ventilation and cleaning to deter mold and mildew growth. Avoid carpeting on concrete or damp floors, and storing items in damp areas. Control of House Dust Mite Allergen . Dust mite allergens are a common trigger of allergy and asthma symptoms. While they can be found throughout the house, these microscopic creatures thrive in warm, humid environments such as bedding, upholstered furniture and carpeting. . Because so much time is spent in the bedroom, it is essential to reduce mite levels there.  . Encase pillows, mattresses, and box springs in special allergen-proof fabric covers or airtight, zippered plastic covers.  . Bedding should be washed weekly in hot water (130 F) and dried in a hot dryer. Allergen-proof covers are available for comforters and pillows that can't be regularly washed.  Wendee Copp the allergy-proof covers every few months. Minimize clutter in the bedroom. Keep pets out of the bedroom.  Marland Kitchen Keep humidity less than 50% by using a dehumidifier or air conditioning. You can buy a humidity measuring device called a hygrometer to monitor this.  . If possible, replace carpets with hardwood, linoleum, or washable area rugs. If that's not possible, vacuum frequently with a vacuum that has a HEPA filter. . Remove all upholstered furniture and non-washable window drapes from the bedroom. . Remove all non-washable stuffed toys from the bedroom.  Wash stuffed toys weekly. Cockroach Allergen Avoidance Cockroaches are often found in the homes of densely populated urban areas, schools or commercial buildings, but these creatures can lurk almost anywhere. This does not mean that you have  a dirty house or living area. . Block all areas where roaches can enter the home. This includes crevices, wall cracks and windows.  . Cockroaches need water to survive, so fix and seal all leaky faucets and pipes. Have an exterminator go through the house when your family and pets are gone to eliminate any remaining roaches. Marland Kitchen Keep food in lidded containers and put pet food dishes away after your pets are done eating. Vacuum and sweep the floor after meals, and take out garbage and recyclables. Use lidded garbage containers in the kitchen. Wash dishes immediately after use and clean under stoves, refrigerators or toasters where crumbs can accumulate. Wipe off the stove and other kitchen surfaces and cupboards regularly. Pet Allergen Avoidance: . Contrary to popular opinion, there are no "hypoallergenic" breeds of dogs or cats. That is because people are not allergic to an animal's hair, but to an allergen found in the animal's saliva, dander (dead skin flakes) or urine. Pet allergy symptoms typically occur within minutes. For some people, symptoms can build up and become most severe 8 to 12 hours after contact with the animal. People with severe allergies can experience reactions in public places if dander has been transported on the pet owners' clothing. Marland Kitchen Keeping an animal outdoors is only a partial solution, since homes with pets in the yard still have higher concentrations of animal allergens. . Before getting a pet, ask your allergist to determine if you are allergic to animals. If your pet  is already considered part of your family, try to minimize contact and keep the pet out of the bedroom and other rooms where you spend a great deal of time. . As with dust mites, vacuum carpets often or replace carpet with a hardwood floor, tile or linoleum. . High-efficiency particulate air (HEPA) cleaners can reduce allergen levels over time. . While dander and saliva are the source of cat and dog allergens,  urine is the source of allergens from rabbits, hamsters, mice and [place] pigs; so ask a non-allergic family member to clean the animal's cage. . If you have a pet allergy, talk to your allergist about the potential for allergy immunotherapy (allergy shots). This strategy can often provide long-term relief.

## 2019-03-19 NOTE — Addendum Note (Signed)
Addended by: Mliss Fritz I on: 03/19/2019 01:27 PM   Modules accepted: Orders

## 2019-03-19 NOTE — Assessment & Plan Note (Signed)
   See assessment plan as above for allergic rhinitis. 

## 2019-03-19 NOTE — Progress Notes (Signed)
New Patient Note  RE: Rhonda Pennington MRN: [id number] DOB: Jul 22, 1985 Date of Office Visit: 03/19/2019  Referring provider: Delma Officer, PA Primary care provider: Delma Officer, [place]  Chief Complaint: Allergy Testing (mainly environmental)  History of Present Illness: I had the pleasure of seeing Rhonda Pennington for initial evaluation at the [facility] on 03/19/2019. She is a 34 y.o. female, who is referred here by Delma Officer, PA for the evaluation of allergic rhinitis.  Rhinitis: She reports symptoms of nasal congestion, rhinorrhea, sneezing, itchy nose/ear, sinus pressure, headaches, itchy/watery eyes. Symptoms have been going on for 4-5 years. The symptoms are present all year around with worsening in spring and fall. Other triggers include exposure to unsure. Anosmia: no. Headache: yes. She has used zyrtec, Flonase 1 spray QD with some improvement in symptoms. Sinus infections: no. Previous work up includes: none. Previous ENT evaluation: yes about 2 months ago. Previous sinus imaging: no. History of nasal polyps: no. Last eye exam: not recently. History of reflux: yes and taking Nexium.  Assessment and Plan: Rhonda Pennington is a 34 y.o. female with: Other allergic rhinitis Perennial rhinoconjunctivitis symptoms for the past 4 to 5 years with worsening spring and fall.  Currently on Zyrtec and Flonase with some benefit.  Patient was seen by ENT 2 months ago.  History of reflux and taking Nexium as needed.  Today's skin testing showed:  Positive to grass, weed, ragweed, trees, molds, dust mites, cockroaches and cats. Results given.   Start environmental control measures.  Start Singulair (montelukast) 10mg  daily at night. Cautioned that in some children/adults can experience behavioral changes including hyperactivity, agitation, depression, sleep disturbances and suicidal ideations. These side effects are rare, but if you notice them you should notify me and  discontinue Singulair (montelukast).  May use over the counter antihistamines such as Zyrtec (cetirizine), Claritin (loratadine), Allegra (fexofenadine), or Xyzal (levocetirizine) daily as needed. May take twice a day if needed during allergy flares.   Continue Flonase 1 spray per nostril 1-2 times a day as needed for nasal symptoms.  Nasal saline spray (i.e., Simply Saline) or nasal saline lavage (i.e., NeilMed) is recommended as needed and prior to medicated nasal sprays.  May use Pazeo 1 drop in each eye daily as needed for itchy/watery eyes.   Had a detailed discussion with patient/family that clinical history is suggestive of allergic rhinitis, and may benefit from allergy immunotherapy (AIT). Discussed in detail regarding the dosing, schedule, side effects (mild to moderate local allergic reaction and rarely systemic allergic reactions including anaphylaxis), and benefits (significant improvement in nasal symptoms, seasonal flares of asthma) of immunotherapy with the patient. There is significant time commitment involved with allergy shots, which includes weekly immunotherapy injections for first 9-12 months and then biweekly to monthly injections for 3-5 years.   Read about allergy injections.   Allergic conjunctivitis of both eyes  See assessment plan as above for allergic rhinitis.  Heartburn Continue daily PPI medication for reflux.  Return in about 3 months (around 06/16/2019).  Meds ordered this encounter  Medications  . montelukast (SINGULAIR) 10 MG tablet    Sig: Take 1 tablet (10 mg total) by mouth at bedtime.    Dispense:  30 tablet    Refill:  5  . DISCONTD: Olopatadine HCl (PAZEO) 0.7 % SOLN    Sig: Apply 1 drop to eye daily as needed (itchy/watery eyes).    Dispense:  2.5 mL    Refill:  5  Other allergy screening: Asthma: no Rhino conjunctivitis: yes Food allergy: no Medication allergy: no Hymenoptera allergy: no Urticaria: no Eczema:no History of recurrent  infections suggestive of immunodeficency: no  Diagnostics: Skin Testing: Environmental allergy panel. Positive test to: grass, weed, ragweed, trees, molds, dust mites, cockroaches and cats.  Results discussed with patient/family. Airborne Adult Perc - 03/19/19 0900    Time Antigen Placed  2130    Allergen Manufacturer  Waynette Buttery    Location  Back    Number of Test  59    1. Control-Buffer 50% Glycerol  Negative    2. Control-Histamine 1 mg/ml  2+    3. Albumin saline  Negative    4. Bahia  2+    5. [place]  3+    6. Johnson  3+    7. Kentucky Blue  3+    8. Meadow Fescue  3+    9. Perennial Rye  4+    10. Sweet Vernal  2+    11. Timothy  3+    12. Cocklebur  --   +/-   13. Burweed Marshelder  --   +/-   14. Ragweed, short  2+    15. Ragweed, Giant  2+    16. Plantain,  English  --   +/-   17. Lamb's Quarters  --   +/-   18. Sheep Sorrell  --   +/-   19. Rough Pigweed  2+    20. Rayson Elder, Rough  2+    21. Mugwort, Common  2+    22. Ash mix  2+    23. Birch mix  3+    24. Beech American  3+    25. Box, Elder  2+    26. Cedar, red  2+    27. Cottonwood, Eastern  2+    28. Elm mix  2+    29. Hickory mix  3+    30. Maple mix  Negative    31. Oak, Guinea-Bissau mix  4+    32. Pecan Pollen  4+    33. Pine mix  Negative    34. Sycamore Eastern  3+    35. Walnut, Black Pollen  3+    36. Alternaria alternata  2+    37. Cladosporium Herbarum  --   +/-   38. Aspergillus mix  --   +/-   39. Penicillium mix  2+    40. Bipolaris sorokiniana (Helminthosporium)  3+    41. Drechslera spicifera (Curvularia)  3+    42. Mucor plumbeus  3+    43. Fusarium moniliforme  Negative    44. Aureobasidium pullulans (pullulara)  Negative    45. Rhizopus oryzae  3+    46. Botrytis cinera  3+    47. Epicoccum nigrum  3+    48. Phoma betae  2+    49. Candida Albicans  2+    50. Trichophyton mentagrophytes  Negative    51. Mite, D Farinae  5,000 AU/ml  3+    52. Mite, D Pteronyssinus   5,000 AU/ml  3+    53. Cat Hair 10,000 BAU/ml  Negative    54.  Dog Epithelia  Negative    55. Mixed Feathers  Negative    56. Horse Epithelia  Negative    57. Cockroach, German  2+    58. Mouse  Negative    59. Tobacco Leaf  Negative     Intradermal - 03/19/19 1000  Time Antigen Placed  0076    Allergen Manufacturer  Greer    Location  Arm    Number of Test  3    Control  Negative    Cat  2+    Dog  Negative       Past Medical History: Patient Active Problem List   Diagnosis Date Noted  . Other allergic rhinitis 03/19/2019  . Allergic conjunctivitis of both eyes 03/19/2019  . Heartburn 03/19/2019  . [redacted] weeks gestation of pregnancy 01/20/2017  . Postpartum state 01/20/2017  . Active labor at term 09/22/2013  . Status post vacuum-assisted vaginal delivery 09/22/2013   Past Medical History:  Diagnosis Date  . Medical history non-contributory    Past Surgical History: Past Surgical History:  Procedure Laterality Date  . ADENOIDECTOMY    . OVARIAN CYST SURGERY Right 2003  . TONSILLECTOMY    . TYMPANOSTOMY TUBE PLACEMENT     Medication List:  Current Outpatient Medications  Medication Sig Dispense Refill  . cetirizine (ZYRTEC) 10 MG tablet Take 10 mg by mouth daily.    . fluticasone (FLONASE) 50 MCG/ACT nasal spray fluticasone propionate 50 mcg/actuation nasal spray,suspension    . montelukast (SINGULAIR) 10 MG tablet Take 1 tablet (10 mg total) by mouth at bedtime. 30 tablet 5  . omeprazole (PRILOSEC) 20 MG capsule Take 20 mg by mouth daily as needed (heartburn).    Marland Kitchen PATANOL 0.1 % ophthalmic solution Please specify directions, refills and quantity 1 mL 5  . Prenatal Vit-Fe Fumarate-FA (PRENATAL MULTIVITAMIN) TABS tablet Take 1 tablet by mouth daily.     No current facility-administered medications for this visit.   Allergies: No Known Allergies Social History: Social History   Socioeconomic History  . Marital status: Married    Spouse name: Not on file  .  Number of children: Not on file  . Years of education: Not on file  . Highest education level: Not on file  Occupational History  . Not on file  Tobacco Use  . Smoking status: Never Smoker  . Smokeless tobacco: Never Used  Substance and Sexual Activity  . Alcohol use: No  . Drug use: No  . Sexual activity: Yes    Birth control/protection: None  Other Topics Concern  . Not on file  Social History Narrative  . Not on file   Social Determinants of Health   Financial Resource Strain:   . Difficulty of Paying Living Expenses: Not on file  Food Insecurity:   . Worried About Charity fundraiser in the Last Year: Not on file  . Ran Out of Food in the Last Year: Not on file  Transportation Needs:   . Lack of Transportation (Medical): Not on file  . Lack of Transportation (Non-Medical): Not on file  Physical Activity:   . Days of Exercise per Week: Not on file  . Minutes of Exercise per Session: Not on file  Stress:   . Feeling of Stress : Not on file  Social Connections:   . Frequency of Communication with Friends and Family: Not on file  . Frequency of Social Gatherings with Friends and Family: Not on file  . Attends Religious Services: Not on file  . Active Member of Clubs or Organizations: Not on file  . Attends Archivist Meetings: Not on file  . Marital Status: Not on file   Lives in a 32 year old home. Smoking: denies Occupation: stays at home  Environmental History: Water Damage/mildew in the  house: no Carpet in the family room: no Carpet in the bedroom: yes Heating: gas Cooling: central Pet: yes 2 dogs x 1 yr, 6 yrs  Family History: Family History  Problem Relation Age of Onset  . Immunodeficiency Mother   . Atopy Brother    Problem                               Relation Asthma                                   Brother as a child Eczema                                Brother  Food allergy                          Brother - alpha gal Allergic  rhino conjunctivitis     Father, brother   Review of Systems  Constitutional: Negative for appetite change, chills, fever and unexpected weight change.  HENT: Positive for congestion, rhinorrhea and sneezing.   Eyes: Positive for itching.  Respiratory: Negative for cough, chest tightness, shortness of breath and wheezing.   Cardiovascular: Negative for chest pain.  Gastrointestinal: Negative for abdominal pain.  Genitourinary: Negative for difficulty urinating.  Skin: Negative for rash.  Allergic/Immunologic: Positive for environmental allergies. Negative for food allergies.  Neurological: Negative for headaches.   Objective: BP 112/76 (BP Location: Left Arm, Patient Position: Sitting, Cuff Size: Normal)   Pulse (!) 103   Temp 98.3 F (36.8 C) (Temporal)   Resp 18   Ht 5\' 6"  (1.676 m)   Wt 145 lb 12.8 oz (66.1 kg)   SpO2 98%   BMI 23.53 kg/m  Body mass index is 23.53 kg/m. Physical Exam  Constitutional: She is oriented to person, place, and time. She appears well-developed and well-nourished.  HENT:  Head: Normocephalic and atraumatic.  Right Ear: External ear normal.  Left Ear: External ear normal.  Nose: Nose normal.  Soft palate erythema  Eyes: Conjunctivae and EOM are normal.  Cardiovascular: Normal rate, regular rhythm and normal heart sounds. Exam reveals no gallop and no friction rub.  No murmur heard. Pulmonary/Chest: Effort normal and breath sounds normal. She has no wheezes. She has no rales.  Abdominal: Soft.  Musculoskeletal:     Cervical back: Neck supple.  Neurological: She is alert and oriented to person, place, and time.  Skin: Skin is warm. No rash noted.  Psychiatric: She has a normal mood and affect. Her behavior is normal.  Nursing note and vitals reviewed.  The plan was reviewed with the patient/family, and all questions/concerned were addressed.  It was my pleasure to see Rhonda Pennington today and participate in her care. Please feel free to contact me  with any questions or concerns.  Sincerely,  Misty Stanley, DO Allergy & Immunology  [facility] [place] office: [phone] [facility] office: [phone] [place] office: [phone]

## 2019-03-19 NOTE — Assessment & Plan Note (Signed)
Continue daily PPI medication for reflux.

## 2019-03-20 ENCOUNTER — Other Ambulatory Visit: Payer: Self-pay

## 2019-03-20 NOTE — Telephone Encounter (Signed)
Pazeo is not covered by patient insurance please send in alternative medication Patanol 0.1%

## 2019-03-21 DIAGNOSIS — K219 Gastro-esophageal reflux disease without esophagitis: Secondary | ICD-10-CM | POA: Diagnosis not present

## 2019-03-21 MED ORDER — OLOPATADINE HCL 0.1 % OP SOLN: 1.0000 [drp] | Freq: Two times a day (BID) | OPHTHALMIC | 5 refills | Status: DC | PRN

## 2019-05-08 DIAGNOSIS — Z3201 Encounter for pregnancy test, result positive: Secondary | ICD-10-CM | POA: Diagnosis not present

## 2019-05-08 DIAGNOSIS — Z369 Encounter for antenatal screening, unspecified: Secondary | ICD-10-CM | POA: Diagnosis not present

## 2019-05-08 DIAGNOSIS — N925 Other specified irregular menstruation: Secondary | ICD-10-CM | POA: Diagnosis not present

## 2019-05-08 DIAGNOSIS — Z3480 Encounter for supervision of other normal pregnancy, unspecified trimester: Secondary | ICD-10-CM | POA: Diagnosis not present

## 2019-05-08 DIAGNOSIS — Z3481 Encounter for supervision of other normal pregnancy, first trimester: Secondary | ICD-10-CM | POA: Diagnosis not present

## 2019-05-08 DIAGNOSIS — Z6823 Body mass index (BMI) 23.0-23.9, adult: Secondary | ICD-10-CM | POA: Diagnosis not present

## 2019-06-05 DIAGNOSIS — Z3480 Encounter for supervision of other normal pregnancy, unspecified trimester: Secondary | ICD-10-CM | POA: Diagnosis not present

## 2019-06-05 DIAGNOSIS — O30041 Twin pregnancy, dichorionic/diamniotic, first trimester: Secondary | ICD-10-CM | POA: Diagnosis not present

## 2019-06-05 DIAGNOSIS — O3680X9 Pregnancy with inconclusive fetal viability, other fetus: Secondary | ICD-10-CM | POA: Diagnosis not present

## 2019-06-05 LAB — OB RESULTS CONSOLE GC/CHLAMYDIA
Chlamydia: NEGATIVE
Gonorrhea: NEGATIVE

## 2019-06-05 LAB — OB RESULTS CONSOLE ABO/RH: RH Type: POSITIVE

## 2019-06-05 LAB — OB RESULTS CONSOLE HEPATITIS B SURFACE ANTIGEN: Hepatitis B Surface Ag: NEGATIVE

## 2019-06-05 LAB — OB RESULTS CONSOLE HIV ANTIBODY (ROUTINE TESTING): HIV: NONREACTIVE

## 2019-06-05 LAB — OB RESULTS CONSOLE ANTIBODY SCREEN: Antibody Screen: NEGATIVE

## 2019-06-05 LAB — OB RESULTS CONSOLE RUBELLA ANTIBODY, IGM: Rubella: IMMUNE

## 2019-06-05 LAB — OB RESULTS CONSOLE RPR: RPR: NONREACTIVE

## 2019-06-20 ENCOUNTER — Ambulatory Visit: Payer: BC Managed Care – PPO | Admitting: Allergy

## 2019-06-20 DIAGNOSIS — J3089 Other allergic rhinitis: Secondary | ICD-10-CM | POA: Insufficient documentation

## 2019-06-20 NOTE — Progress Notes (Deleted)
Follow Up Note  RE: Rhonda Pennington MRN: [id number] DOB: [birth year] Date of Office Visit: 06/20/2019  Referring provider: Delma Officer, PA Primary care provider: Delma Officer, [place]  Chief Complaint: No chief complaint on file.  History of Present Illness: I had the pleasure of seeing [facility] for a follow up visit at the [facility] on 06/20/2019. She is a 34 y.o. female, who is being followed for allergic rhinoconjunctivitis and heartburn. Her previous allergy office visit was on 03/19/2019 with Dr. Selena Batten. Today is a regular follow up visit.  Other allergic rhinitis Perennial rhinoconjunctivitis symptoms for the past 4 to 5 years with worsening spring and fall.  Currently on Zyrtec and Flonase with some benefit.  Patient was seen by ENT 2 months ago.  History of reflux and taking Nexium as needed.  Today's skin testing showed:  Positive to grass, weed, ragweed, trees, molds, dust mites, cockroaches and cats. Results given.   Start environmental control measures.   Start Singulair (montelukast) 10mg  daily at night.  Cautioned that in some children/adults can experience behavioral changes including hyperactivity, agitation, depression, sleep disturbances and suicidal ideations. These side effects are rare, but if you notice them you should notify me and discontinue Singulair (montelukast).  May use over the counter antihistamines such as Zyrtec (cetirizine), Claritin (loratadine), Allegra (fexofenadine), or Xyzal (levocetirizine) daily as needed. May take twice a day if needed during allergy flares.   Continue Flonase 1 spray per nostril 1-2 times a day as needed for nasal symptoms.  Nasal saline spray (i.e., Simply Saline) or nasal saline lavage (i.e., NeilMed) is recommended as needed and prior to medicated nasal sprays.  May use Pazeo 1 drop in each eye daily as needed for itchy/watery eyes.   Had a detailed discussion with patient/family that clinical  history is suggestive of allergic rhinitis, and may benefit from allergy immunotherapy (AIT). Discussed in detail regarding the dosing, schedule, side effects (mild to moderate local allergic reaction and rarely systemic allergic reactions including anaphylaxis), and benefits (significant improvement in nasal symptoms, seasonal flares of asthma) of immunotherapy with the patient. There is significant time commitment involved with allergy shots, which includes weekly immunotherapy injections for first 9-12 months and then biweekly to monthly injections for 3-5 years.   Read about allergy injections.   Allergic conjunctivitis of both eyes  See assessment plan as above for allergic rhinitis.  Heartburn Continue daily PPI medication for reflux.  Return in about 3 months (around 06/16/2019).  Assessment and Plan: Rhonda Pennington is a 34 y.o. female with: No problem-specific Assessment & Plan notes found for this encounter.  No follow-ups on file.  No orders of the defined types were placed in this encounter.  Lab Orders  No laboratory test(s) ordered today    Diagnostics: Spirometry:  Tracings reviewed. Her effort: {Blank single:19197::"Good reproducible efforts.","It was hard to get consistent efforts and there is a question as to whether this reflects a maximal maneuver.","Poor effort, data can not be interpreted."} FVC: ***L FEV1: ***L, ***% predicted FEV1/FVC ratio: ***% Interpretation: {Blank single:19197::"Spirometry consistent with mild obstructive disease","Spirometry consistent with moderate obstructive disease","Spirometry consistent with severe obstructive disease","Spirometry consistent with possible restrictive disease","Spirometry consistent with mixed obstructive and restrictive disease","Spirometry uninterpretable due to technique","Spirometry consistent with normal pattern","No overt abnormalities noted given today's efforts"}.  Please see scanned spirometry results for details.   Skin Testing: {Blank single:19197::"Select foods","Environmental allergy panel","Environmental allergy panel and select foods","Food allergy panel","None","Deferred due to recent antihistamines use"}. Positive  test to: ***. Negative test to: ***.  Results discussed with patient/family.   Medication List:  Current Outpatient Medications  Medication Sig Dispense Refill  . cetirizine (ZYRTEC) 10 MG tablet Take 10 mg by mouth daily.    . fluticasone (FLONASE) 50 MCG/ACT nasal spray fluticasone propionate 50 mcg/actuation nasal spray,suspension    . montelukast (SINGULAIR) 10 MG tablet Take 1 tablet (10 mg total) by mouth at bedtime. 30 tablet 5  . olopatadine (PATANOL) 0.1 % ophthalmic solution Place 1 drop into both eyes 2 (two) times daily as needed for allergies (itchy/watery eyes). 5 mL 5  . omeprazole (PRILOSEC) 20 MG capsule Take 20 mg by mouth daily as needed (heartburn).    . Prenatal Vit-Fe Fumarate-FA (PRENATAL MULTIVITAMIN) TABS tablet Take 1 tablet by mouth daily.     No current facility-administered medications for this visit.   Allergies: No Known Allergies I reviewed her past medical history, social history, family history, and environmental history and no significant changes have been reported from her previous visit.  Review of Systems  Constitutional: Negative for appetite change, chills, fever and unexpected weight change.  HENT: Positive for congestion, rhinorrhea and sneezing.   Eyes: Positive for itching.  Respiratory: Negative for cough, chest tightness, shortness of breath and wheezing.   Cardiovascular: Negative for chest pain.  Gastrointestinal: Negative for abdominal pain.  Genitourinary: Negative for difficulty urinating.  Skin: Negative for rash.  Allergic/Immunologic: Positive for environmental allergies. Negative for food allergies.  Neurological: Negative for headaches.   Objective: There were no vitals taken for this visit. There is no height or weight  on file to calculate BMI. Physical Exam  Constitutional: She is oriented to person, place, and time. She appears well-developed and well-nourished.  HENT:  Head: Normocephalic and atraumatic.  Right Ear: External ear normal.  Left Ear: External ear normal.  Nose: Nose normal.  Soft palate erythema  Eyes: Conjunctivae and EOM are normal.  Cardiovascular: Normal rate, regular rhythm and normal heart sounds. Exam reveals no gallop and no friction rub.  No murmur heard. Pulmonary/Chest: Effort normal and breath sounds normal. She has no wheezes. She has no rales.  Abdominal: Soft.  Musculoskeletal:     Cervical back: Neck supple.  Neurological: She is alert and oriented to person, place, and time.  Skin: Skin is warm. No rash noted.  Psychiatric: She has a normal mood and affect. Her behavior is normal.  Nursing note and vitals reviewed.  Previous notes and tests were reviewed. The plan was reviewed with the patient/family, and all questions/concerned were addressed.  It was my pleasure to see Phiona today and participate in her care. Please feel free to contact me with any questions or concerns.  Sincerely,  Rexene Alberts, DO Allergy & Immunology  [facility] office: [phone] [facility] office: [place] office: [phone]

## 2019-07-06 DIAGNOSIS — Z369 Encounter for antenatal screening, unspecified: Secondary | ICD-10-CM | POA: Diagnosis not present

## 2019-07-19 DIAGNOSIS — O30042 Twin pregnancy, dichorionic/diamniotic, second trimester: Secondary | ICD-10-CM | POA: Diagnosis not present

## 2019-07-19 DIAGNOSIS — Z363 Encounter for antenatal screening for malformations: Secondary | ICD-10-CM | POA: Diagnosis not present

## 2019-08-15 DIAGNOSIS — O30042 Twin pregnancy, dichorionic/diamniotic, second trimester: Secondary | ICD-10-CM | POA: Diagnosis not present

## 2019-08-15 DIAGNOSIS — Z362 Encounter for other antenatal screening follow-up: Secondary | ICD-10-CM | POA: Diagnosis not present

## 2019-08-15 DIAGNOSIS — Z369 Encounter for antenatal screening, unspecified: Secondary | ICD-10-CM | POA: Diagnosis not present

## 2019-09-07 DIAGNOSIS — O99019 Anemia complicating pregnancy, unspecified trimester: Secondary | ICD-10-CM | POA: Diagnosis not present

## 2019-09-07 DIAGNOSIS — Z369 Encounter for antenatal screening, unspecified: Secondary | ICD-10-CM | POA: Diagnosis not present

## 2019-09-07 DIAGNOSIS — Z3480 Encounter for supervision of other normal pregnancy, unspecified trimester: Secondary | ICD-10-CM | POA: Diagnosis not present

## 2019-09-21 DIAGNOSIS — O30042 Twin pregnancy, dichorionic/diamniotic, second trimester: Secondary | ICD-10-CM | POA: Diagnosis not present

## 2019-10-05 DIAGNOSIS — Z369 Encounter for antenatal screening, unspecified: Secondary | ICD-10-CM | POA: Diagnosis not present

## 2019-10-05 DIAGNOSIS — O99019 Anemia complicating pregnancy, unspecified trimester: Secondary | ICD-10-CM | POA: Diagnosis not present

## 2019-10-19 DIAGNOSIS — O30042 Twin pregnancy, dichorionic/diamniotic, second trimester: Secondary | ICD-10-CM | POA: Diagnosis not present

## 2019-10-29 DIAGNOSIS — Z3480 Encounter for supervision of other normal pregnancy, unspecified trimester: Secondary | ICD-10-CM | POA: Diagnosis not present

## 2019-11-01 DIAGNOSIS — L299 Pruritus, unspecified: Secondary | ICD-10-CM | POA: Diagnosis not present

## 2019-11-01 DIAGNOSIS — Z369 Encounter for antenatal screening, unspecified: Secondary | ICD-10-CM | POA: Diagnosis not present

## 2019-11-06 DIAGNOSIS — L299 Pruritus, unspecified: Secondary | ICD-10-CM | POA: Diagnosis not present

## 2019-11-13 DIAGNOSIS — O30042 Twin pregnancy, dichorionic/diamniotic, second trimester: Secondary | ICD-10-CM | POA: Diagnosis not present

## 2019-11-13 DIAGNOSIS — L299 Pruritus, unspecified: Secondary | ICD-10-CM | POA: Diagnosis not present

## 2019-11-15 DIAGNOSIS — O318X31 Other complications specific to multiple gestation, third trimester, fetus 1: Secondary | ICD-10-CM | POA: Diagnosis not present

## 2019-11-15 DIAGNOSIS — O30043 Twin pregnancy, dichorionic/diamniotic, third trimester: Secondary | ICD-10-CM | POA: Diagnosis not present

## 2019-11-15 DIAGNOSIS — O26843 Uterine size-date discrepancy, third trimester: Secondary | ICD-10-CM | POA: Diagnosis not present

## 2019-11-20 ENCOUNTER — Other Ambulatory Visit: Payer: Self-pay | Admitting: Obstetrics and Gynecology

## 2019-11-21 ENCOUNTER — Encounter (HOSPITAL_COMMUNITY): Payer: Self-pay | Admitting: Obstetrics and Gynecology

## 2019-11-21 ENCOUNTER — Other Ambulatory Visit: Payer: Self-pay

## 2019-11-21 ENCOUNTER — Inpatient Hospital Stay (HOSPITAL_COMMUNITY)
Admission: AD | Admit: 2019-11-21 | Discharge: 2019-11-21 | Disposition: A | Discharge status: Home / Self Care | Payer: BC Managed Care – PPO | Attending: Obstetrics and Gynecology | Admitting: Obstetrics and Gynecology

## 2019-11-21 DIAGNOSIS — O4703 False labor before 37 completed weeks of gestation, third trimester: Secondary | ICD-10-CM | POA: Diagnosis not present

## 2019-11-21 DIAGNOSIS — O4700 False labor before 37 completed weeks of gestation, unspecified trimester: Secondary | ICD-10-CM

## 2019-11-21 DIAGNOSIS — Z3A35 35 weeks gestation of pregnancy: Secondary | ICD-10-CM | POA: Insufficient documentation

## 2019-11-21 DIAGNOSIS — Z79899 Other long term (current) drug therapy: Secondary | ICD-10-CM | POA: Insufficient documentation

## 2019-11-21 NOTE — MAU Note (Signed)
Patient c/o contractions since 0700, denies ROM, VB. Describes the contractions as irregular every 5-20 minutes and rates them at 2-8 on a 10 point pain scale. States that A and B fetus have been in breech position.

## 2019-11-21 NOTE — Discharge Instructions (Signed)
Return to MAU for more than 6 painful contractions in 1 hour, vaginal bleeding like a period or running down your leg, leaking of fluid, decreased or no fetal movement.

## 2019-11-21 NOTE — MAU Provider Note (Addendum)
History     CSN: [id number]  Arrival date and time: 11/21/19 1116   First Provider Initiated Contact with Patient 11/21/19 1250      Chief Complaint  Patient presents with  . Contractions   Ms. Rhonda Pennington is a 34 y.o. year old G13P2002 female at [redacted]w[redacted]d weeks twin gestation who presents to MAU reporting irregular contractions since 0700 every 5-20 minutes. She rates the contractions 2-8/10 stating "the contractions are all over the place." She called [facility] office this morning and was advised to come to MAU for evaluation.   OB History    Gravida  3   Para  2   Term  2   Preterm  0   AB  0   Living  2     SAB  0   TAB  0   Ectopic  0   Multiple  0   Live Births  2           Past Medical History:  Diagnosis Date  . Medical history non-contributory     Past Surgical History:  Procedure Laterality Date  . ADENOIDECTOMY    . OVARIAN CYST SURGERY Right 2003  . TONSILLECTOMY    . TYMPANOSTOMY TUBE PLACEMENT      Family History  Problem Relation Age of Onset  . Immunodeficiency Mother   . Atopy Brother     Social History   Tobacco Use  . Smoking status: Never Smoker  . Smokeless tobacco: Never Used  Substance Use Topics  . Alcohol use: No  . Drug use: No    Allergies: No Known Allergies  Medications Prior to Admission  Medication Sig Dispense Refill Last Dose  . cetirizine (ZYRTEC) 10 MG tablet Take 10 mg by mouth daily.     . fluticasone (FLONASE) 50 MCG/ACT nasal spray fluticasone propionate 50 mcg/actuation nasal spray,suspension     . montelukast (SINGULAIR) 10 MG tablet Take 1 tablet (10 mg total) by mouth at bedtime. 30 tablet 5   . olopatadine (PATANOL) 0.1 % ophthalmic solution Place 1 drop into both eyes 2 (two) times daily as needed for allergies (itchy/watery eyes). 5 mL 5   . omeprazole (PRILOSEC) 20 MG capsule Take 20 mg by mouth daily as needed (heartburn).     . Prenatal Vit-Fe Fumarate-FA (PRENATAL MULTIVITAMIN) TABS tablet  Take 1 tablet by mouth daily.       Review of Systems  Constitutional: Negative.   HENT: Negative.   Eyes: Negative.   Respiratory: Negative.   Cardiovascular: Negative.   Gastrointestinal: Negative.   Endocrine: Negative.   Genitourinary: Positive for pelvic pain (contractions).  Musculoskeletal: Negative.   Skin: Negative.   Allergic/Immunologic: Negative.   Neurological: Negative.   Hematological: Negative.   Psychiatric/Behavioral: Negative.    Physical Exam   Blood pressure 121/71, pulse 81, temperature 98.1 F (36.7 C), temperature source Oral, resp. rate 20, height 5\' 7"  (1.702 m), weight 89.4 kg, SpO2 99 %, unknown if currently breastfeeding.  Physical Exam Vitals and nursing note reviewed. Exam conducted with a chaperone present.  Constitutional:      Appearance: Normal appearance. She is normal weight.  HENT:     Head: Normocephalic and atraumatic.     Nose: Nose normal.  Eyes:     Pupils: Pupils are equal, round, and reactive to light.  Cardiovascular:     Rate and Rhythm: Normal rate.     Pulses: Normal pulses.  Pulmonary:     Effort: Pulmonary effort is  normal.  Abdominal:     Palpations: Abdomen is soft.  Genitourinary:    General: Normal vulva.     Comments: Dilation: Closed Effacement (%): Thick Cervical Position: Posterior Station: Ballotable Presentation: Undeterminable Exam by: Carloyn Jaeger, CNM  Musculoskeletal:        General: Normal range of motion.     Cervical back: Normal range of motion.  Skin:    General: Skin is warm and dry.  Neurological:     Mental Status: She is alert and oriented to person, place, and time.  Psychiatric:        Mood and Affect: Mood normal.        Behavior: Behavior normal.        Thought Content: Thought content normal.        Judgment: Judgment normal.     MAU Course  Procedures  MDM CEFM SVE x 2  *Consult with Dr. Timothy Lasso @ [phone] - notified of patient's complaints, assessments, NST, and cervical  exam results, tx plan d/c home and f/u in office - ok to d/c home, agrees with plan -- Dr. Timothy Lasso will send a message to the office to have patient seen sooner than her appt scheduled for next week.  Assessment and Plan  Preterm contractions - Plan: Discharge patient - Information provided on preterm labor, preventing preterm birth and BH ctxs  - Dr. Timothy Lasso will arrange getting patient seen in the office sooner than scheduled - Patient verbalized an understanding of the plan of care and agrees.   Raelyn Mora, MSN, CNM 11/21/2019, 12:50 PM

## 2019-11-22 DIAGNOSIS — Z369 Encounter for antenatal screening, unspecified: Secondary | ICD-10-CM | POA: Diagnosis not present

## 2019-11-22 DIAGNOSIS — Z3480 Encounter for supervision of other normal pregnancy, unspecified trimester: Secondary | ICD-10-CM | POA: Diagnosis not present

## 2019-11-22 LAB — OB RESULTS CONSOLE GBS: GBS: NEGATIVE

## 2019-11-28 DIAGNOSIS — O30042 Twin pregnancy, dichorionic/diamniotic, second trimester: Secondary | ICD-10-CM | POA: Diagnosis not present

## 2019-11-29 ENCOUNTER — Encounter (HOSPITAL_COMMUNITY): Payer: Self-pay

## 2019-11-29 NOTE — Patient Instructions (Signed)
[facility]  11/29/2019   Your procedure is scheduled on:  11/12/2019  Arrive at 1245 at Entrance C on [address] at [facility]  and [employer]. You are invited to use the FREE valet parking or use the Visitor's parking deck.  Pick up the phone at the desk and dial [phone].  Call this number if you have problems the morning of surgery: [phone]  Remember:   Do not eat food:(After Midnight) Desps de medianoche.  Do not drink clear liquids: (After Midnight) Desps de medianoche.  Take these medicines the morning of surgery with A SIP OF WATER:  none   Do not wear jewelry, make-up or nail polish.  Do not wear lotions, powders, or perfumes. Do not wear deodorant.  Do not shave 48 hours prior to surgery.  Do not bring valuables to the hospital.  [facility] is not   responsible for any belongings or valuables brought to the hospital.  Contacts, dentures or bridgework may not be worn into surgery.  Leave suitcase in the car. After surgery it may be brought to your room.  For patients admitted to the hospital, checkout time is 11:00 AM the day of              discharge.      Please read over the following fact sheets that you were given:     Preparing for Surgery

## 2019-12-04 DIAGNOSIS — O30043 Twin pregnancy, dichorionic/diamniotic, third trimester: Secondary | ICD-10-CM | POA: Diagnosis not present

## 2019-12-10 ENCOUNTER — Other Ambulatory Visit (HOSPITAL_COMMUNITY): Payer: BC Managed Care – PPO

## 2019-12-10 ENCOUNTER — Other Ambulatory Visit (HOSPITAL_COMMUNITY)
Admission: RE | Admit: 2019-12-10 | Discharge: 2019-12-10 | Disposition: A | Discharge status: Home / Self Care | Payer: BC Managed Care – PPO | Source: Ambulatory Visit | Attending: Obstetrics and Gynecology | Admitting: Obstetrics and Gynecology

## 2019-12-10 DIAGNOSIS — D62 Acute posthemorrhagic anemia: Secondary | ICD-10-CM | POA: Diagnosis not present

## 2019-12-10 DIAGNOSIS — Z01818 Encounter for other preprocedural examination: Secondary | ICD-10-CM | POA: Insufficient documentation

## 2019-12-10 DIAGNOSIS — Z412 Encounter for routine and ritual male circumcision: Secondary | ICD-10-CM | POA: Diagnosis not present

## 2019-12-10 DIAGNOSIS — O328XX2 Maternal care for other malpresentation of fetus, fetus 2: Secondary | ICD-10-CM | POA: Diagnosis not present

## 2019-12-10 DIAGNOSIS — O43813 Placental infarction, third trimester: Secondary | ICD-10-CM | POA: Diagnosis not present

## 2019-12-10 DIAGNOSIS — O321XX2 Maternal care for breech presentation, fetus 2: Secondary | ICD-10-CM | POA: Diagnosis not present

## 2019-12-10 DIAGNOSIS — Z20822 Contact with and (suspected) exposure to covid-19: Secondary | ICD-10-CM | POA: Diagnosis not present

## 2019-12-10 DIAGNOSIS — O30043 Twin pregnancy, dichorionic/diamniotic, third trimester: Secondary | ICD-10-CM | POA: Diagnosis not present

## 2019-12-10 DIAGNOSIS — Z23 Encounter for immunization: Secondary | ICD-10-CM | POA: Diagnosis not present

## 2019-12-10 DIAGNOSIS — O9081 Anemia of the puerperium: Secondary | ICD-10-CM | POA: Diagnosis not present

## 2019-12-10 DIAGNOSIS — Z302 Encounter for sterilization: Secondary | ICD-10-CM | POA: Diagnosis not present

## 2019-12-10 DIAGNOSIS — O43893 Other placental disorders, third trimester: Secondary | ICD-10-CM | POA: Diagnosis not present

## 2019-12-10 DIAGNOSIS — Z3A39 39 weeks gestation of pregnancy: Secondary | ICD-10-CM | POA: Diagnosis not present

## 2019-12-10 LAB — SARS CORONAVIRUS 2 (TAT 6-24 HRS): SARS Coronavirus 2: NEGATIVE

## 2019-12-11 ENCOUNTER — Encounter (HOSPITAL_COMMUNITY)
Admission: RE | Admit: 2019-12-11 | Discharge: 2019-12-11 | Disposition: A | Discharge status: Home / Self Care | Payer: BC Managed Care – PPO | Source: Ambulatory Visit | Attending: Obstetrics and Gynecology | Admitting: Obstetrics and Gynecology

## 2019-12-11 ENCOUNTER — Other Ambulatory Visit: Payer: Self-pay

## 2019-12-11 ENCOUNTER — Other Ambulatory Visit (HOSPITAL_COMMUNITY): Payer: BC Managed Care – PPO

## 2019-12-11 DIAGNOSIS — Z01812 Encounter for preprocedural laboratory examination: Secondary | ICD-10-CM | POA: Insufficient documentation

## 2019-12-11 DIAGNOSIS — O30043 Twin pregnancy, dichorionic/diamniotic, third trimester: Secondary | ICD-10-CM | POA: Diagnosis not present

## 2019-12-11 HISTORY — DX: Personal history of other complications of pregnancy, childbirth and the puerperium: Z87.59

## 2019-12-11 LAB — CBC
HCT: 30.5 % — ABNORMAL LOW (ref 36.0–46.0)
Hemoglobin: 9.5 g/dL — ABNORMAL LOW (ref 12.0–15.0)
MCH: 25.5 pg — ABNORMAL LOW (ref 26.0–34.0)
MCHC: 31.1 g/dL (ref 30.0–36.0)
MCV: 81.8 fL (ref 80.0–100.0)
Platelets: 190 10*3/uL (ref 150–400)
RBC: 3.73 MIL/uL — ABNORMAL LOW (ref 3.87–5.11)
RDW: 15.1 % (ref 11.5–15.5)
WBC: 9.6 10*3/uL (ref 4.0–10.5)
nRBC: 0 % (ref 0.0–0.2)

## 2019-12-11 LAB — TYPE AND SCREEN
ABO/RH(D): O POS
Antibody Screen: NEGATIVE

## 2019-12-12 LAB — RPR: RPR Ser Ql: NONREACTIVE

## 2019-12-13 ENCOUNTER — Inpatient Hospital Stay (HOSPITAL_COMMUNITY): Payer: BC Managed Care – PPO | Admitting: Anesthesiology

## 2019-12-13 ENCOUNTER — Other Ambulatory Visit: Payer: Self-pay

## 2019-12-13 ENCOUNTER — Encounter (HOSPITAL_COMMUNITY): Payer: Self-pay | Admitting: Certified Registered Nurse Anesthetist

## 2019-12-13 ENCOUNTER — Encounter (HOSPITAL_COMMUNITY): Payer: Self-pay | Admitting: Obstetrics and Gynecology

## 2019-12-13 ENCOUNTER — Inpatient Hospital Stay (HOSPITAL_COMMUNITY)
Admission: RE | Admit: 2019-12-13 | Discharge: 2019-12-15 | DRG: 784 | Disposition: A | Discharge status: Home / Self Care | Payer: BC Managed Care – PPO | Attending: Obstetrics and Gynecology | Admitting: Obstetrics and Gynecology

## 2019-12-13 ENCOUNTER — Inpatient Hospital Stay (HOSPITAL_COMMUNITY): Payer: BC Managed Care – PPO

## 2019-12-13 ENCOUNTER — Encounter (HOSPITAL_COMMUNITY)
Admission: RE | Disposition: A | Discharge status: Home / Self Care | Payer: Self-pay | Source: Home / Self Care | Attending: Obstetrics and Gynecology

## 2019-12-13 DIAGNOSIS — O321XX2 Maternal care for breech presentation, fetus 2: Secondary | ICD-10-CM | POA: Diagnosis present

## 2019-12-13 DIAGNOSIS — O9081 Anemia of the puerperium: Secondary | ICD-10-CM | POA: Diagnosis not present

## 2019-12-13 DIAGNOSIS — Z3490 Encounter for supervision of normal pregnancy, unspecified, unspecified trimester: Principal | ICD-10-CM

## 2019-12-13 DIAGNOSIS — O30043 Twin pregnancy, dichorionic/diamniotic, third trimester: Secondary | ICD-10-CM | POA: Diagnosis present

## 2019-12-13 DIAGNOSIS — D62 Acute posthemorrhagic anemia: Secondary | ICD-10-CM | POA: Diagnosis not present

## 2019-12-13 DIAGNOSIS — Z3A39 39 weeks gestation of pregnancy: Secondary | ICD-10-CM

## 2019-12-13 DIAGNOSIS — Z20822 Contact with and (suspected) exposure to covid-19: Secondary | ICD-10-CM | POA: Diagnosis present

## 2019-12-13 DIAGNOSIS — O329XX0 Maternal care for malpresentation of fetus, unspecified, not applicable or unspecified: Secondary | ICD-10-CM

## 2019-12-13 DIAGNOSIS — O30009 Twin pregnancy, unspecified number of placenta and unspecified number of amniotic sacs, unspecified trimester: Secondary | ICD-10-CM | POA: Diagnosis present

## 2019-12-13 DIAGNOSIS — Z302 Encounter for sterilization: Secondary | ICD-10-CM | POA: Diagnosis not present

## 2019-12-13 SURGERY — Surgical Case
Anesthesia: Spinal | Wound class: Clean Contaminated

## 2019-12-13 SURGERY — Surgical Case
Anesthesia: Regional

## 2019-12-13 MED ORDER — FLEET ENEMA 7-19 GM/118ML RE ENEM: 1.0000 | ENEMA | Freq: Every day | RECTAL | Status: DC | PRN

## 2019-12-13 MED ORDER — ACETAMINOPHEN 10 MG/ML IV SOLN
INTRAVENOUS | Status: AC
  Filled 2019-12-13: qty 100

## 2019-12-13 MED ORDER — LACTATED RINGERS IV SOLN: INTRAVENOUS | Status: DC

## 2019-12-13 MED ORDER — KETOROLAC TROMETHAMINE 30 MG/ML IJ SOLN: 30.0000 mg | Freq: Once | INTRAMUSCULAR | Status: DC

## 2019-12-13 MED ORDER — DIPHENHYDRAMINE HCL 50 MG/ML IJ SOLN: 12.5000 mg | INTRAMUSCULAR | Status: DC | PRN

## 2019-12-13 MED ORDER — DEXAMETHASONE SODIUM PHOSPHATE 4 MG/ML IJ SOLN
INTRAMUSCULAR | Status: AC
  Filled 2019-12-13: qty 1

## 2019-12-13 MED ORDER — SENNOSIDES-DOCUSATE SODIUM 8.6-50 MG PO TABS
2.0000 | ORAL_TABLET | ORAL | Status: DC
  Administered 2019-12-13 – 2019-12-14 (×2): 2 via ORAL
  Filled 2019-12-13 (×2): qty 2

## 2019-12-13 MED ORDER — PHENYLEPHRINE HCL (PRESSORS) 10 MG/ML IV SOLN
INTRAVENOUS | Status: DC | PRN
  Administered 2019-12-13: 120 ug via INTRAVENOUS
  Administered 2019-12-13: 160 ug via INTRAVENOUS
  Administered 2019-12-13: 120 ug via INTRAVENOUS
  Administered 2019-12-13 (×2): 160 ug via INTRAVENOUS
  Administered 2019-12-13: 120 ug via INTRAVENOUS

## 2019-12-13 MED ORDER — BISACODYL 10 MG RE SUPP: 10.0000 mg | Freq: Every day | RECTAL | Status: DC | PRN

## 2019-12-13 MED ORDER — ONDANSETRON HCL 4 MG/2ML IJ SOLN
INTRAMUSCULAR | Status: AC
  Filled 2019-12-13: qty 2

## 2019-12-13 MED ORDER — DIPHENHYDRAMINE HCL 25 MG PO CAPS: 25.0000 mg | ORAL_CAPSULE | ORAL | Status: DC | PRN

## 2019-12-13 MED ORDER — MENTHOL 3 MG MT LOZG: 1.0000 | LOZENGE | OROMUCOSAL | Status: DC | PRN

## 2019-12-13 MED ORDER — ZOLPIDEM TARTRATE 5 MG PO TABS: 5.0000 mg | ORAL_TABLET | Freq: Every evening | ORAL | Status: DC | PRN

## 2019-12-13 MED ORDER — FENTANYL CITRATE (PF) 100 MCG/2ML IJ SOLN
INTRAMUSCULAR | Status: DC | PRN
  Administered 2019-12-13: 15 ug via INTRATHECAL

## 2019-12-13 MED ORDER — PRENATAL MULTIVITAMIN CH
1.0000 | ORAL_TABLET | Freq: Every day | ORAL | Status: DC
  Administered 2019-12-14 – 2019-12-15 (×2): 1 via ORAL
  Filled 2019-12-13 (×2): qty 1

## 2019-12-13 MED ORDER — ONDANSETRON HCL 4 MG/2ML IJ SOLN: 4.0000 mg | Freq: Three times a day (TID) | INTRAMUSCULAR | Status: DC | PRN

## 2019-12-13 MED ORDER — LIDOCAINE HCL (PF) 1 % IJ SOLN: 30.0000 mL | INTRAMUSCULAR | Status: DC | PRN

## 2019-12-13 MED ORDER — LACTATED RINGERS IV SOLN: 500.0000 mL | INTRAVENOUS | Status: DC | PRN

## 2019-12-13 MED ORDER — TETANUS-DIPHTH-ACELL PERTUSSIS 5-2.5-18.5 LF-MCG/0.5 IM SUSY: 0.5000 mL | PREFILLED_SYRINGE | Freq: Once | INTRAMUSCULAR | Status: DC

## 2019-12-13 MED ORDER — PHENYLEPHRINE HCL-NACL 20-0.9 MG/250ML-% IV SOLN
INTRAVENOUS | Status: DC | PRN
  Administered 2019-12-13: 60 ug/min via INTRAVENOUS

## 2019-12-13 MED ORDER — SIMETHICONE 80 MG PO CHEW
80.0000 mg | CHEWABLE_TABLET | Freq: Three times a day (TID) | ORAL | Status: DC
  Administered 2019-12-14 – 2019-12-15 (×5): 80 mg via ORAL
  Filled 2019-12-13 (×5): qty 1

## 2019-12-13 MED ORDER — TERBUTALINE SULFATE 1 MG/ML IJ SOLN: 0.2500 mg | Freq: Once | INTRAMUSCULAR | Status: DC | PRN

## 2019-12-13 MED ORDER — OXYTOCIN BOLUS FROM INFUSION: 333.0000 mL | Freq: Once | INTRAVENOUS | Status: DC

## 2019-12-13 MED ORDER — SCOPOLAMINE 1 MG/3DAYS TD PT72
1.0000 | MEDICATED_PATCH | Freq: Once | TRANSDERMAL | Status: DC
  Administered 2019-12-13: 1.5 mg via TRANSDERMAL
  Filled 2019-12-13 (×2): qty 1

## 2019-12-13 MED ORDER — MORPHINE SULFATE (PF) 0.5 MG/ML IJ SOLN
INTRAMUSCULAR | Status: DC | PRN
  Administered 2019-12-13: .15 mg via INTRATHECAL

## 2019-12-13 MED ORDER — MONTELUKAST SODIUM 10 MG PO TABS
10.0000 mg | ORAL_TABLET | Freq: Every day | ORAL | Status: DC
  Filled 2019-12-13: qty 1

## 2019-12-13 MED ORDER — PHENYLEPHRINE HCL-NACL 20-0.9 MG/250ML-% IV SOLN
INTRAVENOUS | Status: AC
  Filled 2019-12-13: qty 250

## 2019-12-13 MED ORDER — DEXMEDETOMIDINE (PRECEDEX) IN NS 20 MCG/5ML (4 MCG/ML) IV SYRINGE
PREFILLED_SYRINGE | INTRAVENOUS | Status: DC | PRN
  Administered 2019-12-13: 4 ug via INTRAVENOUS

## 2019-12-13 MED ORDER — DEXAMETHASONE SODIUM PHOSPHATE 4 MG/ML IJ SOLN
INTRAMUSCULAR | Status: DC | PRN
  Administered 2019-12-13: 4 mg via INTRAVENOUS

## 2019-12-13 MED ORDER — WITCH HAZEL-GLYCERIN EX PADS: 1.0000 "application " | MEDICATED_PAD | CUTANEOUS | Status: DC | PRN

## 2019-12-13 MED ORDER — NALBUPHINE HCL 10 MG/ML IJ SOLN: 5.0000 mg | Freq: Once | INTRAMUSCULAR | Status: DC | PRN

## 2019-12-13 MED ORDER — FENTANYL CITRATE (PF) 100 MCG/2ML IJ SOLN: 25.0000 ug | INTRAMUSCULAR | Status: DC | PRN

## 2019-12-13 MED ORDER — FLUTICASONE PROPIONATE 50 MCG/ACT NA SUSP
1.0000 | Freq: Every day | NASAL | Status: DC | PRN
  Filled 2019-12-13: qty 16

## 2019-12-13 MED ORDER — FERROUS SULFATE 325 (65 FE) MG PO TABS
325.0000 mg | ORAL_TABLET | Freq: Two times a day (BID) | ORAL | Status: DC
  Administered 2019-12-14 – 2019-12-15 (×3): 325 mg via ORAL
  Filled 2019-12-13 (×3): qty 1

## 2019-12-13 MED ORDER — FENTANYL CITRATE (PF) 100 MCG/2ML IJ SOLN
INTRAMUSCULAR | Status: AC
  Filled 2019-12-13: qty 2

## 2019-12-13 MED ORDER — MORPHINE SULFATE (PF) 0.5 MG/ML IJ SOLN
INTRAMUSCULAR | Status: AC
  Filled 2019-12-13: qty 10

## 2019-12-13 MED ORDER — DEXMEDETOMIDINE (PRECEDEX) IN NS 20 MCG/5ML (4 MCG/ML) IV SYRINGE
PREFILLED_SYRINGE | INTRAVENOUS | Status: AC
  Filled 2019-12-13: qty 5

## 2019-12-13 MED ORDER — POVIDONE-IODINE 10 % EX SWAB: 2.0000 "application " | Freq: Once | CUTANEOUS | Status: DC

## 2019-12-13 MED ORDER — OXYTOCIN-SODIUM CHLORIDE 30-0.9 UT/500ML-% IV SOLN: 2.5000 [IU]/h | INTRAVENOUS | Status: DC

## 2019-12-13 MED ORDER — ACETAMINOPHEN 500 MG PO TABS: 1000.0000 mg | ORAL_TABLET | ORAL | Status: DC

## 2019-12-13 MED ORDER — KETOROLAC TROMETHAMINE 30 MG/ML IJ SOLN: 30.0000 mg | Freq: Four times a day (QID) | INTRAMUSCULAR | Status: AC | PRN

## 2019-12-13 MED ORDER — OXYTOCIN-SODIUM CHLORIDE 30-0.9 UT/500ML-% IV SOLN
INTRAVENOUS | Status: AC
  Filled 2019-12-13: qty 500

## 2019-12-13 MED ORDER — SIMETHICONE 80 MG PO CHEW
80.0000 mg | CHEWABLE_TABLET | ORAL | Status: DC
  Administered 2019-12-13 – 2019-12-14 (×2): 80 mg via ORAL
  Filled 2019-12-13 (×2): qty 1

## 2019-12-13 MED ORDER — KETOROLAC TROMETHAMINE 30 MG/ML IJ SOLN
30.0000 mg | Freq: Once | INTRAMUSCULAR | Status: AC | PRN
  Administered 2019-12-13: 30 mg via INTRAVENOUS

## 2019-12-13 MED ORDER — FLEET ENEMA 7-19 GM/118ML RE ENEM: 1.0000 | ENEMA | RECTAL | Status: DC | PRN

## 2019-12-13 MED ORDER — SODIUM CHLORIDE 0.9% FLUSH: 3.0000 mL | INTRAVENOUS | Status: DC | PRN

## 2019-12-13 MED ORDER — OXYTOCIN-SODIUM CHLORIDE 30-0.9 UT/500ML-% IV SOLN
INTRAVENOUS | Status: DC | PRN
  Administered 2019-12-13 (×2): 150 mL via INTRAVENOUS

## 2019-12-13 MED ORDER — OXYCODONE-ACETAMINOPHEN 5-325 MG PO TABS
1.0000 | ORAL_TABLET | ORAL | Status: DC | PRN
  Administered 2019-12-14 – 2019-12-15 (×4): 1 via ORAL
  Filled 2019-12-13: qty 2
  Filled 2019-12-13 (×3): qty 1

## 2019-12-13 MED ORDER — DIPHENHYDRAMINE HCL 25 MG PO CAPS: 25.0000 mg | ORAL_CAPSULE | Freq: Four times a day (QID) | ORAL | Status: DC | PRN

## 2019-12-13 MED ORDER — OXYCODONE-ACETAMINOPHEN 5-325 MG PO TABS: 2.0000 | ORAL_TABLET | ORAL | Status: DC | PRN

## 2019-12-13 MED ORDER — BUPIVACAINE IN DEXTROSE 0.75-8.25 % IT SOLN
INTRATHECAL | Status: DC | PRN
  Administered 2019-12-13: 1.8 mL via INTRATHECAL

## 2019-12-13 MED ORDER — METHYLERGONOVINE MALEATE 0.2 MG/ML IJ SOLN: 0.2000 mg | INTRAMUSCULAR | Status: DC | PRN

## 2019-12-13 MED ORDER — IBUPROFEN 800 MG PO TABS
800.0000 mg | ORAL_TABLET | Freq: Three times a day (TID) | ORAL | Status: DC
  Administered 2019-12-14 – 2019-12-15 (×3): 800 mg via ORAL
  Filled 2019-12-13 (×4): qty 1

## 2019-12-13 MED ORDER — MEASLES, MUMPS & RUBELLA VAC IJ SOLR: 0.5000 mL | Freq: Once | INTRAMUSCULAR | Status: DC

## 2019-12-13 MED ORDER — ACETAMINOPHEN 10 MG/ML IV SOLN
1000.0000 mg | Freq: Once | INTRAVENOUS | Status: DC | PRN
  Administered 2019-12-13: 1000 mg via INTRAVENOUS

## 2019-12-13 MED ORDER — ACETAMINOPHEN 325 MG PO TABS: 650.0000 mg | ORAL_TABLET | ORAL | Status: DC | PRN

## 2019-12-13 MED ORDER — OXYTOCIN-SODIUM CHLORIDE 30-0.9 UT/500ML-% IV SOLN: 2.5000 [IU]/h | INTRAVENOUS | Status: AC

## 2019-12-13 MED ORDER — CEFAZOLIN SODIUM-DEXTROSE 2-3 GM-%(50ML) IV SOLR
INTRAVENOUS | Status: DC | PRN
  Administered 2019-12-13: 2 g via INTRAVENOUS

## 2019-12-13 MED ORDER — COCONUT OIL OIL: 1.0000 "application " | TOPICAL_OIL | Status: DC | PRN

## 2019-12-13 MED ORDER — DIBUCAINE (PERIANAL) 1 % EX OINT: 1.0000 "application " | TOPICAL_OINTMENT | CUTANEOUS | Status: DC | PRN

## 2019-12-13 MED ORDER — ONDANSETRON HCL 4 MG/2ML IJ SOLN
INTRAMUSCULAR | Status: DC | PRN
  Administered 2019-12-13: 4 mg via INTRAVENOUS

## 2019-12-13 MED ORDER — ACETAMINOPHEN 500 MG PO TABS
1000.0000 mg | ORAL_TABLET | Freq: Four times a day (QID) | ORAL | Status: AC
  Administered 2019-12-13 – 2019-12-14 (×2): 1000 mg via ORAL
  Filled 2019-12-13 (×2): qty 2

## 2019-12-13 MED ORDER — ONDANSETRON HCL 4 MG/2ML IJ SOLN: 4.0000 mg | Freq: Four times a day (QID) | INTRAMUSCULAR | Status: DC | PRN

## 2019-12-13 MED ORDER — KETOROLAC TROMETHAMINE 30 MG/ML IJ SOLN
INTRAMUSCULAR | Status: AC
  Filled 2019-12-13: qty 1

## 2019-12-13 MED ORDER — SOD CITRATE-CITRIC ACID 500-334 MG/5ML PO SOLN: 30.0000 mL | ORAL | Status: DC | PRN

## 2019-12-13 MED ORDER — METHYLERGONOVINE MALEATE 0.2 MG PO TABS: 0.2000 mg | ORAL_TABLET | ORAL | Status: DC | PRN

## 2019-12-13 MED ORDER — NALBUPHINE HCL 10 MG/ML IJ SOLN: 5.0000 mg | INTRAMUSCULAR | Status: DC | PRN

## 2019-12-13 MED ORDER — NALOXONE HCL 0.4 MG/ML IJ SOLN: 0.4000 mg | INTRAMUSCULAR | Status: DC | PRN

## 2019-12-13 MED ORDER — OLOPATADINE HCL 0.1 % OP SOLN
1.0000 [drp] | Freq: Two times a day (BID) | OPHTHALMIC | Status: DC | PRN
  Filled 2019-12-13: qty 5

## 2019-12-13 MED ORDER — SODIUM CHLORIDE 0.9 % IR SOLN
Status: DC | PRN
  Administered 2019-12-13: 1

## 2019-12-13 MED ORDER — PANTOPRAZOLE SODIUM 40 MG PO TBEC
40.0000 mg | DELAYED_RELEASE_TABLET | Freq: Every day | ORAL | Status: DC
  Filled 2019-12-13: qty 1

## 2019-12-13 MED ORDER — KETOROLAC TROMETHAMINE 30 MG/ML IJ SOLN
30.0000 mg | Freq: Four times a day (QID) | INTRAMUSCULAR | Status: AC | PRN
  Administered 2019-12-13 – 2019-12-14 (×3): 30 mg via INTRAVENOUS
  Filled 2019-12-13 (×3): qty 1

## 2019-12-13 MED ORDER — CEFAZOLIN SODIUM-DEXTROSE 2-4 GM/100ML-% IV SOLN: 2.0000 g | INTRAVENOUS | Status: DC

## 2019-12-13 MED ORDER — SOD CITRATE-CITRIC ACID 500-334 MG/5ML PO SOLN: 30.0000 mL | ORAL | Status: DC

## 2019-12-13 MED ORDER — NALOXONE HCL 4 MG/10ML IJ SOLN
1.0000 ug/kg/h | INTRAVENOUS | Status: DC | PRN
  Filled 2019-12-13: qty 5

## 2019-12-13 MED ORDER — OXYTOCIN-SODIUM CHLORIDE 30-0.9 UT/500ML-% IV SOLN
1.0000 m[IU]/min | INTRAVENOUS | Status: DC
  Filled 2019-12-13: qty 500

## 2019-12-13 MED ORDER — SIMETHICONE 80 MG PO CHEW: 80.0000 mg | CHEWABLE_TABLET | ORAL | Status: DC | PRN

## 2019-12-13 MED ORDER — OXYCODONE-ACETAMINOPHEN 5-325 MG PO TABS: 1.0000 | ORAL_TABLET | ORAL | Status: DC | PRN

## 2019-12-13 SURGICAL SUPPLY — 34 items
BENZOIN TINCTURE PRP APPL 2/3 (GAUZE/BANDAGES/DRESSINGS) ×3 IMPLANT
CHLORAPREP W/TINT 26ML (MISCELLANEOUS) ×3 IMPLANT
CLAMP CORD UMBIL (MISCELLANEOUS) IMPLANT
CLIP FILSHIE TUBAL LIGA STRL (Clip) ×3 IMPLANT
CLOSURE STERI STRIP 1/2 X4 (GAUZE/BANDAGES/DRESSINGS) ×2 IMPLANT
CLOSURE WOUND 1/2 X4 (GAUZE/BANDAGES/DRESSINGS) ×1
CLOTH BEACON ORANGE TIMEOUT ST (SAFETY) ×3 IMPLANT
DRSG OPSITE POSTOP 4X10 (GAUZE/BANDAGES/DRESSINGS) ×3 IMPLANT
ELECT REM PT RETURN 9FT ADLT (ELECTROSURGICAL) ×3
ELECTRODE REM PT RTRN 9FT ADLT (ELECTROSURGICAL) ×1 IMPLANT
EXTRACTOR VACUUM BELL STYLE (SUCTIONS) IMPLANT
GLOVE BIO SURGEON STRL SZ7 (GLOVE) ×3 IMPLANT
GLOVE BIOGEL PI IND STRL 7.0 (GLOVE) ×1 IMPLANT
GLOVE BIOGEL PI INDICATOR 7.0 (GLOVE) ×2
GOWN STRL REUS W/TWL LRG LVL3 (GOWN DISPOSABLE) ×6 IMPLANT
KIT ABG SYR 3ML LUER SLIP (SYRINGE) IMPLANT
NEEDLE HYPO 25X5/8 SAFETYGLIDE (NEEDLE) IMPLANT
NS IRRIG 1000ML POUR BTL (IV SOLUTION) ×3 IMPLANT
PACK C SECTION WH (CUSTOM PROCEDURE TRAY) ×3 IMPLANT
PAD OB MATERNITY 4.3X12.25 (PERSONAL CARE ITEMS) ×3 IMPLANT
PENCIL SMOKE EVAC W/HOLSTER (ELECTROSURGICAL) ×3 IMPLANT
RTRCTR C-SECT PINK 25CM LRG (MISCELLANEOUS) ×3 IMPLANT
STRIP CLOSURE SKIN 1/2X4 (GAUZE/BANDAGES/DRESSINGS) ×2 IMPLANT
SUT MNCRL 0 VIOLET CTX 36 (SUTURE) ×3 IMPLANT
SUT MONOCRYL 0 CTX 36 (SUTURE) ×9
SUT PLAIN 2 0 XLH (SUTURE) ×3 IMPLANT
SUT VIC AB 0 CT1 27 (SUTURE) ×6
SUT VIC AB 0 CT1 27XBRD ANBCTR (SUTURE) ×2 IMPLANT
SUT VIC AB 2-0 CT1 27 (SUTURE) ×6
SUT VIC AB 2-0 CT1 TAPERPNT 27 (SUTURE) ×2 IMPLANT
SUT VIC AB 4-0 KS 27 (SUTURE) ×3 IMPLANT
TOWEL OR 17X24 6PK STRL BLUE (TOWEL DISPOSABLE) ×3 IMPLANT
TRAY FOLEY W/BAG SLVR 14FR LF (SET/KITS/TRAYS/PACK) ×3 IMPLANT
WATER STERILE IRR 1000ML POUR (IV SOLUTION) ×3 IMPLANT

## 2019-12-13 NOTE — Brief Op Note (Signed)
12/13/2019  2:31 PM  PATIENT:  Rhonda Pennington  34 y.o. female  PRE-OPERATIVE DIAGNOSIS:  Malpresentation of B, desires sterilization.  POST-OPERATIVE DIAGNOSIS:  same  PROCEDURE:  Procedure(s): CESAREAN SECTION WITH BILATERAL TUBAL LIGATION (N/A)  SURGEON:  Surgeon(s) and Role:    * Carrington Clamp, MD - Primary    * Marinone, Terrance Mass, MD - Assisting   ANESTHESIA:   spinal  EBL: normal for twins, see anethesia  LOCAL MEDICATIONS USED:  NONE  SPECIMEN:  Source of Specimen:  placenta  DISPOSITION OF SPECIMEN:  PATHOLOGY  COUNTS:  YES  TOURNIQUET:  * No tourniquets in log *  DICTATION: .Note written in EPIC  PLAN OF CARE: Admit to inpatient   PATIENT DISPOSITION:  PACU - hemodynamically stable.   Delay start of Pharmacological VTE agent (>24hrs) due to surgical blood loss or risk of bleeding: not applicable

## 2019-12-13 NOTE — Anesthesia Preprocedure Evaluation (Signed)
Anesthesia Evaluation  Patient identified by MRN, date of birth, ID band Patient awake    Reviewed: Allergy & Precautions, NPO status , Patient's Chart, lab work & pertinent test results  Airway Mallampati: II  TM Distance: >3 FB Neck ROM: Full    Dental no notable dental hx.    Pulmonary neg pulmonary ROS,    Pulmonary exam normal breath sounds clear to auscultation       Cardiovascular negative cardio ROS Normal cardiovascular exam Rhythm:Regular Rate:Normal     Neuro/Psych negative neurological ROS  negative psych ROS   GI/Hepatic Neg liver ROS, GERD  ,  Endo/Other  negative endocrine ROS  Renal/GU negative Renal ROS  negative genitourinary   Musculoskeletal negative musculoskeletal ROS (+)   Abdominal   Peds  Hematology  (+) Blood dyscrasia (Hgb 9.5), anemia ,   Anesthesia Other Findings C/S for di/di twins with fetal malposition  Reproductive/Obstetrics (+) Pregnancy                             Anesthesia Physical Anesthesia Plan  ASA: II  Anesthesia Plan: Spinal   Post-op Pain Management:    Induction:   PONV Risk Score and Plan: Treatment may vary due to age or medical condition  Airway Management Planned: Natural Airway  Additional Equipment:   Intra-op Plan:   Post-operative Plan:   Informed Consent: I have reviewed the patients History and Physical, chart, labs and discussed the procedure including the risks, benefits and alternatives for the proposed anesthesia with the patient or authorized representative who has indicated his/her understanding and acceptance.     Dental advisory given  Plan Discussed with: CRNA  Anesthesia Plan Comments:         Anesthesia Quick Evaluation

## 2019-12-13 NOTE — Anesthesia Procedure Notes (Signed)
Spinal  Patient location during procedure: OR Start time: 12/13/2019 1:25 PM End time: 12/13/2019 1:31 PM Staffing Performed: anesthesiologist  Anesthesiologist: Elmer Picker, MD Preanesthetic Checklist Completed: patient identified, IV checked, risks and benefits discussed, surgical consent, monitors and equipment checked, pre-op evaluation and timeout performed Spinal Block Patient position: sitting Prep: DuraPrep and site prepped and draped Patient monitoring: cardiac monitor, continuous pulse ox and blood pressure Approach: midline Location: L3-4 Injection technique: single-shot Needle Needle type: Pencan  Needle gauge: 24 G Needle length: 9 cm Assessment Sensory level: T6 Additional Notes Functioning IV was confirmed and monitors were applied. Sterile prep and drape, including hand hygiene and sterile gloves were used. The patient was positioned and the spine was prepped. The skin was anesthetized with lidocaine.  Free flow of clear CSF was obtained prior to injecting local anesthetic into the CSF.  The spinal needle aspirated freely following injection.  The needle was carefully withdrawn.  The patient tolerated the procedure well.

## 2019-12-13 NOTE — Op Note (Signed)
12/13/2019  2:31 PM  PATIENT:  Rhonda Pennington  34 y.o. female  PRE-OPERATIVE DIAGNOSIS:  Malpresentation of B, desires sterilization.  POST-OPERATIVE DIAGNOSIS:  same  PROCEDURE:  Procedure(s): CESAREAN SECTION WITH BILATERAL TUBAL LIGATION (N/A)  SURGEON:  Surgeon(s) and Role:    * Carrington Clamp, MD - Primary    * Marinone, Terrance Mass, MD - Assisting   ANESTHESIA:   spinal  EBL: normal for twins, see anethesia  LOCAL MEDICATIONS USED:  NONE  SPECIMEN:  Source of Specimen:  placenta  DISPOSITION OF SPECIMEN:  PATHOLOGY  COUNTS:  YES  TOURNIQUET:  * No tourniquets in log *  DICTATION: .Note written in EPIC  PLAN OF CARE: Admit to inpatient   PATIENT DISPOSITION:  PACU - hemodynamically stable.   Delay start of Pharmacological VTE agent (>24hrs) due to surgical blood loss or risk of bleeding: not applicable  Complications:  none Medications:  Ancef, Pitocin Findings:  Baby A, Apgars 9.9, Baby B Apgars 8,9, weight P.   Normal tubes, ovaries and uterus seen.  Baby was skin to skin with mother after birth in the OR.  Technique:  After adequate spinal anesthesia was achieved, the patient was prepped and draped in usual sterile fashion.  A foley catheter was used to drain the bladder.  A pfannanstiel incision was made with the scalpel and carried down to the fascia with the bovie cautery. The fascia was incised in the midline with the scalpel and carried in a transverse curvilinear manner bilaterally.  The fascia was reflected superiorly and inferiorly off the rectus muscles and the muscles split in the midline.  A bowel free portion of the peritoneum was entered bluntly and then extended in a superior and inferior manner with good visualization of the bowel and bladder.  The Alexis instrument was then placed and the vesico-uterine fascia tented up and incised in a transverse curvilinear manner.  A 2 cm transverse incision was made in the upper portion of the lower uterine  segment until the amnion was exposed.   The incision was extended transversely in a blunt manner.  Clear fluid was noted and the Baby A, boy, delivered in the vertex presentation without complication.  The baby was bulb suctioned and the cord was clamped and cut aftet stripping blood from cord into baby.  Baby A was then handed to awaiting Neonatology.    Amnion of B was ruptured and clear fluid was noted; the Baby B, girl,was delivered in the complete breech presentation without complication.  The baby was bulb suctioned and the cord was clamped and cut aftet stripping blood from cord into baby.  Baby B was then handed to awaiting Neonatology.    Both babies had delayed cord clamping.  The placenta was then delivered manually and the uterus cleared of all debris.  The uterine incision was then closed with a running lock stitch of 0 monocryl.  An imbricating layer of 0 monocryl was closed as well. Excellent hemostasis of the uterine incision was achieved and the abdomen was cleared with irrigation.    At this point the tubal ligation was begun.  Prior to the surgery the patient was counseled that the procedure was permanent and all risks, benefits, and alternatives had been discussed.  The each tube in succession was tented up with two babcocks and the filschie clips were placed on the isthmic portion of each tube.  The clips were confirmed to occlude the entire circumference of each tube.  The R ovary was  absent as was the fimbria on the R.  The round ligament on that side was identified separate from this.  The peritoneum was closed with a running stitch of 2-0 vicryl.  This incorporated the rectus muscles as a separate layer.  The fascia was then closed with a running stitch of 0 vicryl.  The subcutaneous layer was closed with interrupted  stitches of 2-0 plain gut.  The skin was closed with 4-0 vicryl on a Keith needle and steri-strips.  The patient tolerated the procedure well and was returned to the  recovery room in stable condition.  All counts were correct times three.  Loney Laurence

## 2019-12-13 NOTE — Anesthesia Postprocedure Evaluation (Signed)
Anesthesia Post Note  Patient: [facility]  Procedure(s) Performed: CESAREAN SECTION WITH BILATERAL TUBAL LIGATION (N/A )     Patient location during evaluation: PACU Anesthesia Type: Spinal Level of consciousness: oriented and awake and alert Pain management: pain level controlled Vital Signs Assessment: post-procedure vital signs reviewed and stable Respiratory status: spontaneous breathing, respiratory function stable and patient connected to nasal cannula oxygen Cardiovascular status: blood pressure returned to baseline and stable Postop Assessment: no headache, no backache and no apparent nausea or vomiting Anesthetic complications: no   No complications documented.  Last Vitals:  Vitals:   12/13/19 1615 12/13/19 1633  BP: 136/86 (!) 143/87  Pulse: (!) 58 61  Resp: 16 16  Temp: (!) 36.4 C 36.5 C  SpO2: 100% 100%    Last Pain:  Vitals:   12/13/19 1633  TempSrc: Axillary  PainSc: 1    Pain Goal:                   Rhonda Pennington

## 2019-12-13 NOTE — H&P (Signed)
34 y.o. [redacted]w[redacted]d  G3P2002 comes in for induction at term for Di/Di twins.  Otherwise has good fetal movement and no bleeding.  Past Medical History:  Diagnosis Date  . History of postpartum hemorrhage   . Medical history non-contributory     Past Surgical History:  Procedure Laterality Date  . ADENOIDECTOMY    . OVARIAN CYST SURGERY Right 2003  . TONSILLECTOMY    . TYMPANOSTOMY TUBE PLACEMENT      OB History  Gravida Para Term Preterm AB Living  3 2 2  0 0 2  SAB TAB Ectopic Multiple Live Births  0 0 0 0 2    # Outcome Date GA Lbr Len/2nd Weight Sex Delivery Anes PTL Lv  3 Current           2 Term 01/20/17 [redacted]w[redacted]d 06:36 / 00:19 3410 g M Vag-Spont EPI  LIV  1 Term 09/22/13 [redacted]w[redacted]d 07:18 / 00:49 3141 g F Vag-Vacuum EPI  LIV    Social History   Socioeconomic History  . Marital status: Married    Spouse name: Not on file  . Number of children: Not on file  . Years of education: Not on file  . Highest education level: Not on file  Occupational History  . Not on file  Tobacco Use  . Smoking status: Never Smoker  . Smokeless tobacco: Never Used  Vaping Use  . Vaping Use: Never used  Substance and Sexual Activity  . Alcohol use: No  . Drug use: No  . Sexual activity: Yes    Birth control/protection: None  Other Topics Concern  . Not on file  Social History Narrative  . Not on file   Social Determinants of Health   Financial Resource Strain:   . Difficulty of Paying Living Expenses: Not on file  Food Insecurity:   . Worried About [redacted]w[redacted]d in the Last Year: Not on file  . Ran Out of Food in the Last Year: Not on file  Transportation Needs:   . Lack of Transportation (Medical): Not on file  . Lack of Transportation (Non-Medical): Not on file  Physical Activity:   . Days of Exercise per Week: Not on file  . Minutes of Exercise per Session: Not on file  Stress:   . Feeling of Stress : Not on file  Social Connections:   . Frequency of Communication with Friends  and Family: Not on file  . Frequency of Social Gatherings with Friends and Family: Not on file  . Attends Religious Services: Not on file  . Active Member of Clubs or Organizations: Not on file  . Attends Programme researcher, broadcasting/film/video Meetings: Not on file  . Marital Status: Not on file  Intimate Partner Violence:   . Fear of Current or Ex-Partner: Not on file  . Emotionally Abused: Not on file  . Physically Abused: Not on file  . Sexually Abused: Not on file   Patient has no known allergies.    Prenatal Transfer Tool  Maternal Diabetes: No Genetic Screening: Normal Maternal Ultrasounds/Referrals: Normal Fetal Ultrasounds or other Referrals:  None Maternal Substance Abuse:  No Significant Maternal Medications:  None Significant Maternal Lab Results: Group B Strep negative  Other PNC: uncomplicated.    Vitals:   12/13/19 0814  BP: (!) 129/93  Pulse: 88  Resp: 18  Temp: 98.8 F (37.1 C)  TempSrc: Oral  SpO2: 100%  Weight: 91.2 kg  Height: 5\' 7"  (1.702 m)    Lungs/Cor:  NAD  Abdomen:  soft, gravid Ex:  no cords, erythema SVE:   3/70/-2 VTX in office FHTs:  A 130s, B 140s, good STV, NST R; Cat 1 tracing. Toco:  occ  Bedside US VTX/tranverse head lower, back up  A/P   After long discussion with pt will induce for Di/Di twins.  If any lack of progress or NRFHTS, will proceed with c/s.  All risks and benefits d/w with pt and husband,  GBS neg.  Watch BPs.  Early epidural.  Rhonda Pennington

## 2019-12-13 NOTE — Progress Notes (Signed)
After bedside US done to confirm again VTX showed A VTX and B breech now.  Pt now feeling less comfortable with vaginal/breech extraction.  Pt desires to have primary c/s with possible BTL.     I agree and will proceed with primary c/s with BTL.    A NST R B NST R   Loney Laurence

## 2019-12-13 NOTE — Transfer of Care (Signed)
Immediate Anesthesia Transfer of Care Note  Patient: [facility]  Procedure(s) Performed: CESAREAN SECTION WITH BILATERAL TUBAL LIGATION (N/A )  Patient Location: PACU  Anesthesia Type:Spinal  Level of Consciousness: awake, alert  and oriented  Airway & Oxygen Therapy: Patient Spontanous Breathing and Patient connected to nasal cannula oxygen  Post-op Assessment: Report given to RN and Post -op Vital signs reviewed and stable  Post vital signs: Reviewed and stable  Last Vitals:  Vitals Value Taken Time  BP 91/77 12/13/19 1445  Temp    Pulse 73 12/13/19 1448  Resp 17 12/13/19 1448  SpO2 100 % 12/13/19 1448  Vitals shown include unvalidated device data.  Last Pain:  Vitals:   12/13/19 0949  TempSrc: Oral  PainSc: 0-No pain         Complications: No complications documented.

## 2019-12-14 LAB — CBC
HCT: 23.9 % — ABNORMAL LOW (ref 36.0–46.0)
Hemoglobin: 7.5 g/dL — ABNORMAL LOW (ref 12.0–15.0)
MCH: 25.6 pg — ABNORMAL LOW (ref 26.0–34.0)
MCHC: 31.4 g/dL (ref 30.0–36.0)
MCV: 81.6 fL (ref 80.0–100.0)
Platelets: 152 10*3/uL (ref 150–400)
RBC: 2.93 MIL/uL — ABNORMAL LOW (ref 3.87–5.11)
RDW: 15.4 % (ref 11.5–15.5)
WBC: 12.1 10*3/uL — ABNORMAL HIGH (ref 4.0–10.5)
nRBC: 0 % (ref 0.0–0.2)

## 2019-12-14 MED ORDER — LACTATED RINGERS IV BOLUS
500.0000 mL | Freq: Once | INTRAVENOUS | Status: AC
  Administered 2019-12-14: 500 mL via INTRAVENOUS

## 2019-12-14 NOTE — Progress Notes (Signed)
Pt c/o intermittent blurred vision when trying to focus on reading material. No other symptoms experienced by Pt. Pt feels safe to walk in room. Discussed with Pt monitoring that complaint over the next 24 hours. If not better to discuss with MD. HGB 7.5

## 2019-12-14 NOTE — Progress Notes (Signed)
Subjective: Postpartum Day 1: Cesarean Delivery Rhonda Pennington states she is feeling well this morning. Has been ambulating without dizziness or lightheadedness. Voiding without issues. Pain well controlled on current regimen. Tolerating PO.   Objective: Patient Vitals for the past 24 hrs:  BP Temp Temp src Pulse Resp SpO2  12/14/19 0806 121/76 98.7 F (37.1 C) Oral 62 18 98 %  12/14/19 0613 130/90 98.4 F (36.9 C) Oral (!) 58 16 98 %  12/14/19 0320 118/78 98.1 F (36.7 C) Oral 62 16 97 %  12/14/19 0105 -- -- -- -- 16 99 %  12/13/19 2300 123/81 98.3 F (36.8 C) Oral (!) 55 16 99 %  12/13/19 1900 -- 98.3 F (36.8 C) Oral -- 16 --  12/13/19 1835 135/88 98.6 F (37 C) Axillary 71 17 99 %  12/13/19 1735 135/88 98.3 F (36.8 C) Axillary 71 17 100 %  12/13/19 1633 (!) 143/87 97.7 F (36.5 C) Axillary 61 16 100 %  12/13/19 1615 136/86 (!) 97.5 F (36.4 C) Axillary (!) 58 16 100 %  12/13/19 1600 126/87 -- -- 68 15 100 %  12/13/19 1545 127/80 -- -- (!) 57 14 100 %  12/13/19 1530 121/74 -- -- 67 (!) 22 100 %  12/13/19 1515 (!) 116/91 -- -- 66 16 100 %  12/13/19 1500 124/77 -- -- 64 18 100 %  12/13/19 1445 91/77 -- -- 73 18 100 %  12/13/19 1442 97/85 98.6 F (37 C) Oral 75 16 100 %  12/13/19 1157 (!) 134/92 -- -- 85 16 --  12/13/19 0949 130/88 98.1 F (36.7 C) Oral 81 18 --    Physical Exam:  General: alert, cooperative and no distress Lochia: appropriate Uterine Fundus: firm Incision: healing well DVT Evaluation: No evidence of DVT seen on physical exam.  Recent Labs    12/11/19 1100 12/14/19 0524  HGB 9.5* 7.5*  HCT 30.5* 23.9*    Assessment/Plan: Status post Cesarean section. Doing well postoperatively.  Continue current care. [facility] [id number] POD#1 sp primary c-section for DCDA twins, malpresentation at [redacted]w[redacted]d 1. AVW:[id number] routine postop care 2. Postoperative anemia: Hgb 7.5 (from 9.5 preop), discussed results with patient. She is currently asymptomatic. Discussed  blood transfusion vs. Expectant management with PO iron. She prefers expectant management with PO iron.  3. Desires circ for female baby: spoke with nursery, requested circ be done tomorrow  [name] 12/14/2019, 8:43 AM

## 2019-12-15 MED ORDER — ONDANSETRON HCL 4 MG PO TABS
4.0000 mg | ORAL_TABLET | Freq: Three times a day (TID) | ORAL | Status: DC | PRN
  Administered 2019-12-15: 4 mg via ORAL
  Filled 2019-12-15: qty 1

## 2019-12-15 MED ORDER — IBUPROFEN 800 MG PO TABS: 800.0000 mg | ORAL_TABLET | Freq: Four times a day (QID) | ORAL | 0 refills | Status: AC | PRN

## 2019-12-15 MED ORDER — ONDANSETRON HCL 4 MG PO TABS: 4.0000 mg | ORAL_TABLET | Freq: Three times a day (TID) | ORAL | 0 refills | Status: AC | PRN

## 2019-12-15 MED ORDER — OXYCODONE HCL 5 MG PO TABS
5.0000 mg | ORAL_TABLET | Freq: Four times a day (QID) | ORAL | 0 refills | Status: AC | PRN
Start: 2019-12-15 — End: ?

## 2019-12-15 NOTE — Discharge Summary (Signed)
Postpartum Discharge Summary      Patient Name: Rhonda Pennington DOB: 15-Sep-1985 MRN: [id number]  Date of admission: 12/13/2019 Delivery date:   MALEE, GRAYS [[id number]]  12/13/2019    KENYETTE, GUNDY [[id number]]  12/13/2019   Delivering provider:    SHARRI, LOYA [[id number]]  [facility], MICHELLE    Osentoski, MARVELENE STONEBERG [[id number]]  Carrington Clamp   Date of discharge: 12/15/2019  Admitting diagnosis: Pregnancy, twins, antepartum [O30.009] Intrauterine pregnancy: [redacted]w[redacted]d     Secondary diagnosis:  Active Problems:   Pregnancy, twins, antepartum  Additional problems: Anemia of pregnancy   Discharge diagnosis: Term Pregnancy Delivered                                              Post partum procedures:None Augmentation: N/A Complications: None  Hospital course: Sceduled C/S   34 y.o. yo G3P3004 at [redacted]w[redacted]d was admitted to the hospital 12/13/2019 for IOL for twin pregnancy.  After initiation of IOL, she decided she did not want to undergo a breech extraction of twin B and requested a cesarean delivery.   Malpresentation.Delivery details are as follows:  Membrane Rupture Time/Date:    KASIDI, SHANKER [[id number]]  1:50 PM    Maragh, JACOBY RITSEMA [[id number]]  1:51 PM  ,   CAYMAN, BROGDEN [[id number]]  12/13/2019    CLEOTHA, WHALIN [[id number]]  12/13/2019    Delivery Method:   Zackery Barefoot [[id number]]  C-Section, Low Transverse    Vanbenschoten, ANAI LIPSON [[id number]]  C-Section, Low Transverse   Details of operation can be found in separate operative note.  Patient had an uncomplicated postpartum course.  She is ambulating, tolerating a regular diet, passing flatus, and urinating well. Patient is discharged home in stable condition on  12/15/19        Newborn Data: Birth date:   MARGARET, COCKERILL [[id number]]  12/13/2019    GWENN, TEODORO [[id number]]  12/13/2019   Birth time:   RUSSIA, SCHEIDERER [[id number]]  1:51 PM    Monds, Chilton Si [[id number]]  1:51 PM   Gender:   JACALYNN, BUZZELL [[id number]]  Female    EVONNE, RINKS [[id number]]  Female   Living status:   RYDER, CHESMORE [[id number]]  OVZCHY    IFOYD, XAJOI NOMVEH [[id number]]  Living   Apgars:   THANDIWE, SIRAGUSA [[id number]]  [address] ALISSANDRA GEOFFROY [[id number]]  8  ,   JACALYNN, BUZZELL [[id number]]  [address] [[id number]]  9   Weight:   GLENETTE, BOOKWALTER [[id number]]  2810 g    CARIANN, KINNAMON [[id number]]  2915 g       Physical exam  Vitals:   12/14/19 1030 12/14/19 1400 12/14/19 1947 12/15/19 0500  BP: 122/81 117/84 (!) 125/93 130/83  Pulse: 63 (!) 56 63 74  Resp: 16 16 17 18   Temp: 98.3 F (36.8 C) 98 F (36.7 C) 98 F (36.7 C) 98.2 F (36.8 C)  TempSrc: Oral Oral  Oral  SpO2: 99%   98%  Weight:      Height:       General: alert, cooperative and no distress Lochia: appropriate Uterine Fundus: firm Incision: Healing well with no significant drainage, No significant erythema DVT Evaluation: No evidence of DVT seen on physical exam. Labs: Lab Results  Component Value Date   WBC 12.1 (H) 12/14/2019   HGB 7.5 (L) 12/14/2019   HCT 23.9 (L) 12/14/2019   MCV 81.6 12/14/2019   PLT 152 12/14/2019   CMP Latest Ref Rng & Units 01/29/2017  Glucose 65 - 99 mg/dL 96  BUN 6 - 20 mg/dL 10  Creatinine 7.34 - 1.93 mg/dL 7.90  Sodium 240 - 973 mmol/L 139  Potassium 3.5 - 5.1 mmol/L 3.9  Chloride 101 - 111 mmol/L 107  CO2 22 - 32 mmol/L 24  Calcium 8.9 - 10.3 mg/dL 9.0  Total Protein 6.5 - 8.1 g/dL 6.9  Total Bilirubin 0.3 - 1.2 mg/dL 0.4  Alkaline Phos 38 - 126 U/L 158(H)  AST 15 - 41 U/L 22  ALT 14 - 54 U/L 20   Edinburgh Score: Edinburgh Postnatal Depression Scale Screening Tool 12/15/2019  I have been able to laugh and see the funny side of things. 0  I have looked forward with enjoyment to things. 0  I have blamed myself unnecessarily when things went wrong. 1  I have been anxious  or worried for no good reason. 0  I have felt scared or panicky for no good reason. 0  Things have been getting on top of me. 0  I have been so unhappy that I have had difficulty sleeping. 0  I have felt sad or miserable. 0  I have been so unhappy that I have been crying. 0  The thought of harming myself has occurred to me. 0  Edinburgh Postnatal Depression Scale Total 1      After visit meds:  Allergies as of 12/15/2019   No Known Allergies     Medication List    STOP taking these medications   olopatadine 0.1 % ophthalmic solution Commonly known as: PATANOL     TAKE these medications   acetaminophen 325 MG tablet Commonly known as: TYLENOL Take 325 mg by mouth 3 (three) times daily as needed (pain.).   cetirizine 10 MG tablet Commonly known as: ZYRTEC Take 10 mg by mouth daily as needed (allergies.).   cholecalciferol 25 MCG (1000 UNIT) tablet Commonly known as: VITAMIN D Take 2,000 Units by mouth daily.   esomeprazole 20 MG capsule Commonly known as: NEXIUM Take 20 mg by mouth daily as needed (acid reflux/indigestion.).   ferrous sulfate 325 (65 FE) MG tablet Take 325 mg by mouth daily.   fluticasone 50 MCG/ACT nasal spray Commonly known as: FLONASE Place 1-2 sprays into both nostrils daily as needed (allergies.).   ibuprofen 800 MG tablet Commonly known as: ADVIL Take 1 tablet (800 mg total) by mouth every 6 (six) hours as needed.   montelukast 10 MG tablet Commonly known as: Singulair Take 1 tablet (10 mg total) by mouth at bedtime.   oxyCODONE 5 MG immediate release tablet Commonly known as: Roxicodone Take 1 tablet (5 mg total) by mouth every 6 (six) hours as needed for severe pain.   prenatal multivitamin Tabs tablet Take 1 tablet by mouth daily.        Discharge home in stable condition Infant Feeding: Bottle Infant Disposition:home with mother Discharge instruction: per After Visit Summary and Postpartum booklet. Activity: Advance as  tolerated. Pelvic rest for 6 weeks.  Diet: routine diet  Postpartum Appointment:4 weeks  Future Appointments:No future appointments. Follow up Visit:  Follow-up Information    Carrington Clamp, MD Follow up in 4 week(s).   Specialty: Obstetrics and Gynecology Contact information: [address]  [place] [address] [phone]                   12/15/2019 Bena Kobel GEFFEL Chestine Spore, MD

## 2019-12-15 NOTE — Progress Notes (Signed)
Subjective: Postpartum Day 2. Cesarean Delivery Rhonda Pennington states she is feeling well this morning. Has been ambulating without dizziness or lightheadedness. Voiding without issues. Pain well controlled on current regimen--other than pain when moving out of bed. Tolerating PO.   Objective: Patient Vitals for the past 24 hrs:  BP Temp Temp src Pulse Resp SpO2  12/15/19 0500 130/83 98.2 F (36.8 C) Oral 74 18 98 %  12/14/19 1947 (!) 125/93 98 F (36.7 C) -- 63 17 --  12/14/19 1400 117/84 98 F (36.7 C) Oral (!) 56 16 --  12/14/19 1030 122/81 98.3 F (36.8 C) Oral 63 16 99 %    Physical Exam:  General: alert, cooperative and no distress Lochia: appropriate Uterine Fundus: firm Incision: healing well DVT Evaluation: No evidence of DVT seen on physical exam.  Recent Labs    12/14/19 0524  HGB 7.5*  HCT 23.9*    Assessment/Plan:  [id number] POD#2 sp primary c-section for DCDA twins, malpresentation  1. LKJ:ZPHXTAVW routine postop care 2. Postoperative anemia: Hgb 7.5 (from 9.5 preop), discussed results with patient. She is currently asymptomatic. PO iron at discharge   Undecided if she desires discharge today or tomorrow.    Dylann Gallier GEFFEL Nneoma Harral 12/15/2019, 9:59 AM

## 2019-12-17 ENCOUNTER — Encounter (HOSPITAL_COMMUNITY): Payer: Self-pay | Admitting: Obstetrics and Gynecology

## 2019-12-17 LAB — SURGICAL PATHOLOGY

## 2020-08-21 DIAGNOSIS — D225 Melanocytic nevi of trunk: Secondary | ICD-10-CM | POA: Diagnosis not present

## 2020-08-21 DIAGNOSIS — D2262 Melanocytic nevi of left upper limb, including shoulder: Secondary | ICD-10-CM | POA: Diagnosis not present

## 2020-08-21 DIAGNOSIS — L858 Other specified epidermal thickening: Secondary | ICD-10-CM | POA: Diagnosis not present

## 2020-08-21 DIAGNOSIS — D2261 Melanocytic nevi of right upper limb, including shoulder: Secondary | ICD-10-CM | POA: Diagnosis not present

## 2021-01-08 DIAGNOSIS — R051 Acute cough: Secondary | ICD-10-CM | POA: Diagnosis not present

## 2021-01-08 DIAGNOSIS — J029 Acute pharyngitis, unspecified: Secondary | ICD-10-CM | POA: Diagnosis not present

## 2021-01-08 DIAGNOSIS — Z20828 Contact with and (suspected) exposure to other viral communicable diseases: Secondary | ICD-10-CM | POA: Diagnosis not present

## 2021-01-08 DIAGNOSIS — R519 Headache, unspecified: Secondary | ICD-10-CM | POA: Diagnosis not present

## 2021-01-08 DIAGNOSIS — J0100 Acute maxillary sinusitis, unspecified: Secondary | ICD-10-CM | POA: Diagnosis not present

## 2021-01-08 DIAGNOSIS — J069 Acute upper respiratory infection, unspecified: Secondary | ICD-10-CM | POA: Diagnosis not present

## 2021-01-30 DIAGNOSIS — Z124 Encounter for screening for malignant neoplasm of cervix: Secondary | ICD-10-CM | POA: Diagnosis not present

## 2021-01-30 DIAGNOSIS — Z6823 Body mass index (BMI) 23.0-23.9, adult: Secondary | ICD-10-CM | POA: Diagnosis not present

## 2021-01-30 DIAGNOSIS — Z01419 Encounter for gynecological examination (general) (routine) without abnormal findings: Secondary | ICD-10-CM | POA: Diagnosis not present

## 2021-02-18 DIAGNOSIS — M419 Scoliosis, unspecified: Secondary | ICD-10-CM | POA: Diagnosis not present

## 2021-04-27 DIAGNOSIS — Z1329 Encounter for screening for other suspected endocrine disorder: Secondary | ICD-10-CM | POA: Diagnosis not present

## 2021-04-27 DIAGNOSIS — Z13220 Encounter for screening for lipoid disorders: Secondary | ICD-10-CM | POA: Diagnosis not present

## 2021-04-27 DIAGNOSIS — Z0000 Encounter for general adult medical examination without abnormal findings: Secondary | ICD-10-CM | POA: Diagnosis not present

## 2021-04-28 DIAGNOSIS — H938X3 Other specified disorders of ear, bilateral: Secondary | ICD-10-CM | POA: Diagnosis not present

## 2021-05-14 DIAGNOSIS — L720 Epidermal cyst: Secondary | ICD-10-CM | POA: Diagnosis not present

## 2021-05-25 DIAGNOSIS — L720 Epidermal cyst: Secondary | ICD-10-CM | POA: Diagnosis not present

## 2021-05-26 DIAGNOSIS — J019 Acute sinusitis, unspecified: Secondary | ICD-10-CM | POA: Diagnosis not present

## 2021-06-09 DIAGNOSIS — D72819 Decreased white blood cell count, unspecified: Secondary | ICD-10-CM | POA: Diagnosis not present

## 2021-07-09 DIAGNOSIS — N760 Acute vaginitis: Secondary | ICD-10-CM | POA: Diagnosis not present

## 2021-07-09 DIAGNOSIS — Z6823 Body mass index (BMI) 23.0-23.9, adult: Secondary | ICD-10-CM | POA: Diagnosis not present

## 2021-07-17 DIAGNOSIS — M6208 Separation of muscle (nontraumatic), other site: Secondary | ICD-10-CM | POA: Diagnosis not present

## 2021-09-20 DIAGNOSIS — Z91030 Bee allergy status: Secondary | ICD-10-CM | POA: Diagnosis not present

## 2021-09-20 DIAGNOSIS — T63441A Toxic effect of venom of bees, accidental (unintentional), initial encounter: Secondary | ICD-10-CM | POA: Diagnosis not present

## 2021-10-02 DIAGNOSIS — N939 Abnormal uterine and vaginal bleeding, unspecified: Secondary | ICD-10-CM | POA: Diagnosis not present

## 2021-10-02 DIAGNOSIS — Z113 Encounter for screening for infections with a predominantly sexual mode of transmission: Secondary | ICD-10-CM | POA: Diagnosis not present

## 2021-11-04 DIAGNOSIS — D225 Melanocytic nevi of trunk: Secondary | ICD-10-CM | POA: Diagnosis not present

## 2021-11-04 DIAGNOSIS — D2261 Melanocytic nevi of right upper limb, including shoulder: Secondary | ICD-10-CM | POA: Diagnosis not present

## 2021-11-04 DIAGNOSIS — L858 Other specified epidermal thickening: Secondary | ICD-10-CM | POA: Diagnosis not present

## 2021-11-04 DIAGNOSIS — D2262 Melanocytic nevi of left upper limb, including shoulder: Secondary | ICD-10-CM | POA: Diagnosis not present

## 2021-12-24 IMAGING — US US OB LIMITED
1 series · 15 of 18 positions shown · non-contrast
Comparison: none

[Series 1: us ob limited · 15 of 18 slices shown]
[im 1/18]
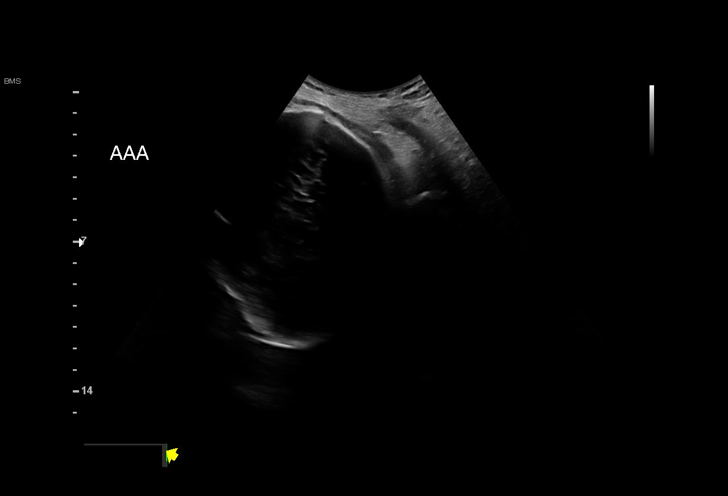
[im 2/18]
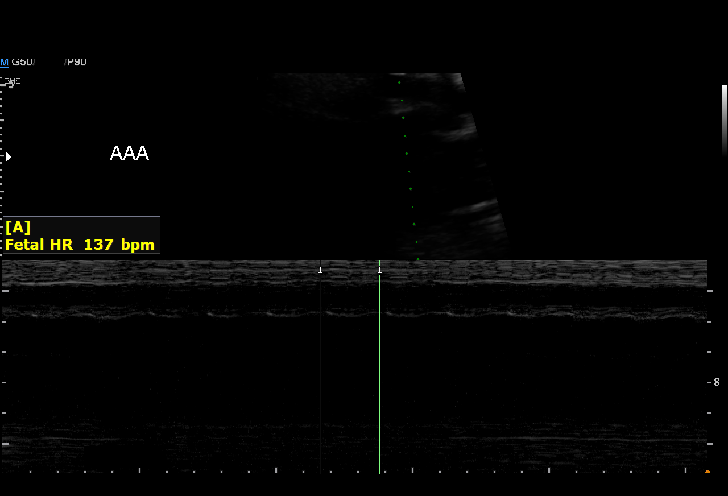
[im 4/18]
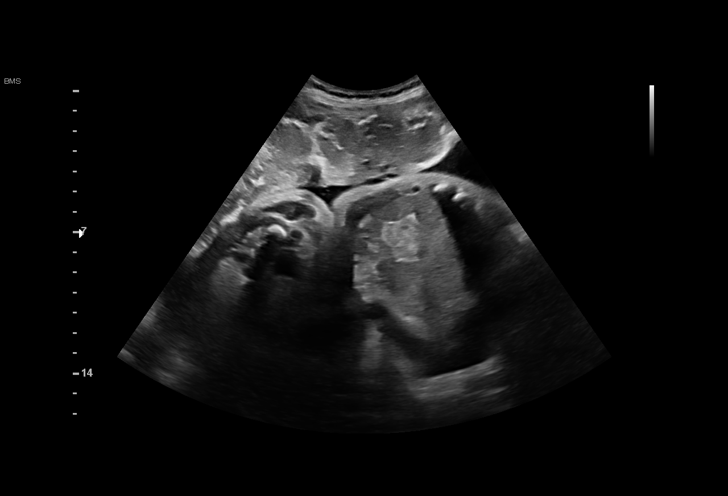
[im 5/18]
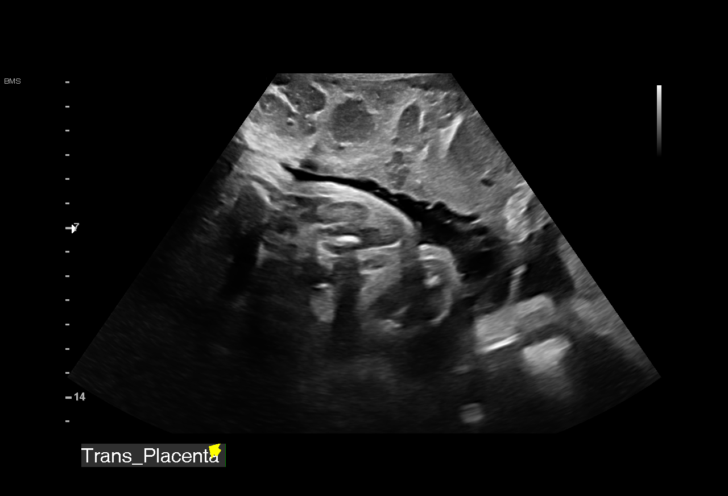
[im 6/18]
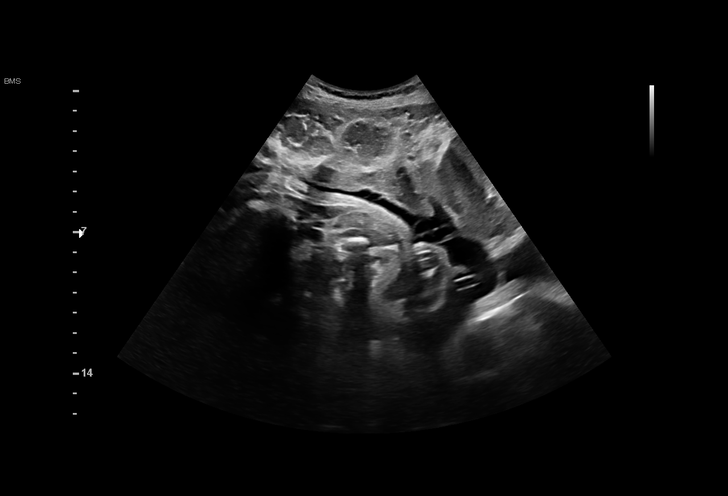
[im 7/18]
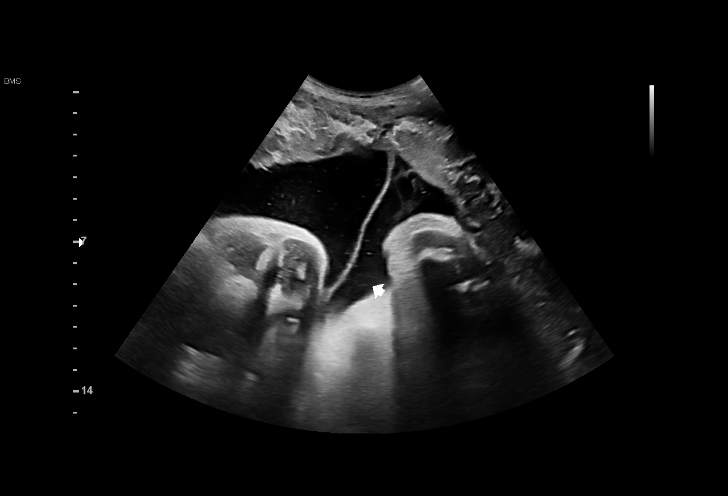
[im 8/18]
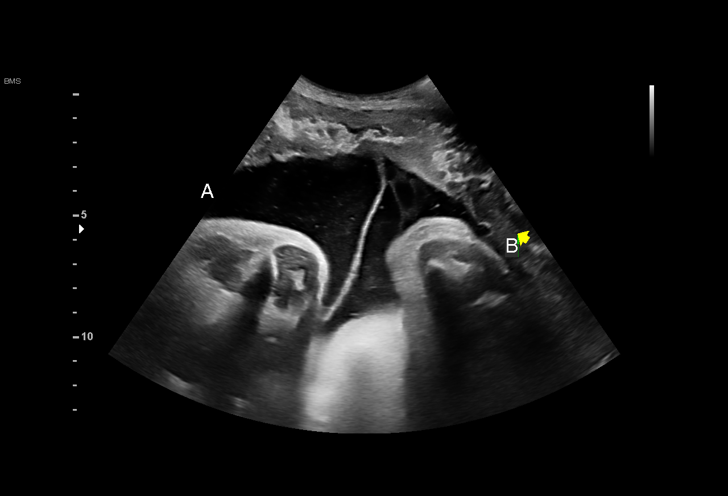
[im 10/18]
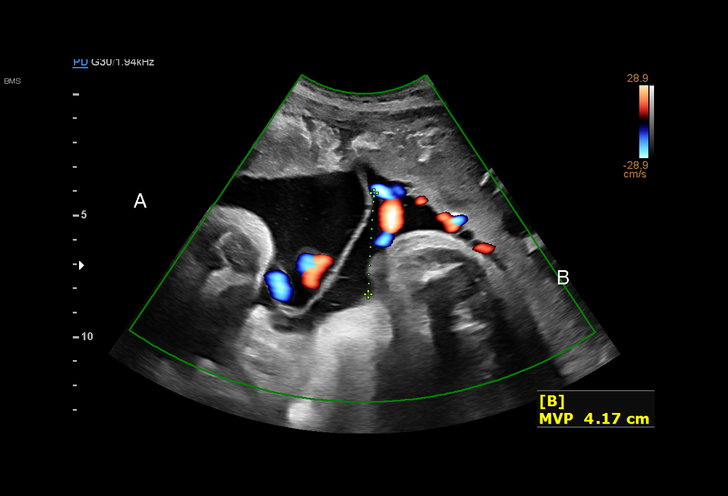
[im 11/18]
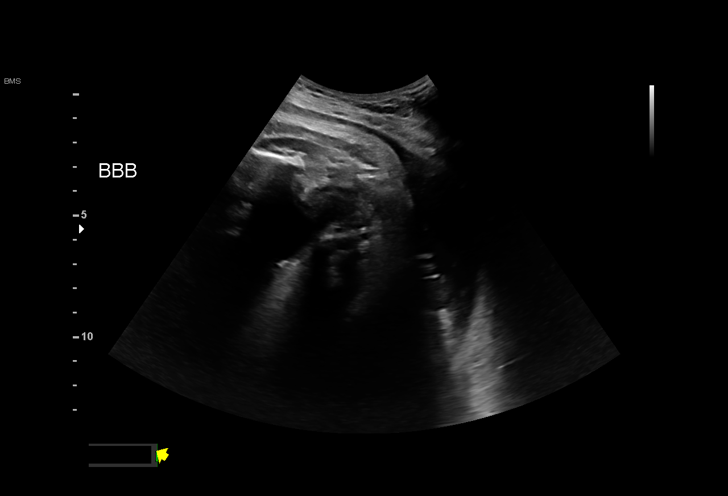
[im 12/18]
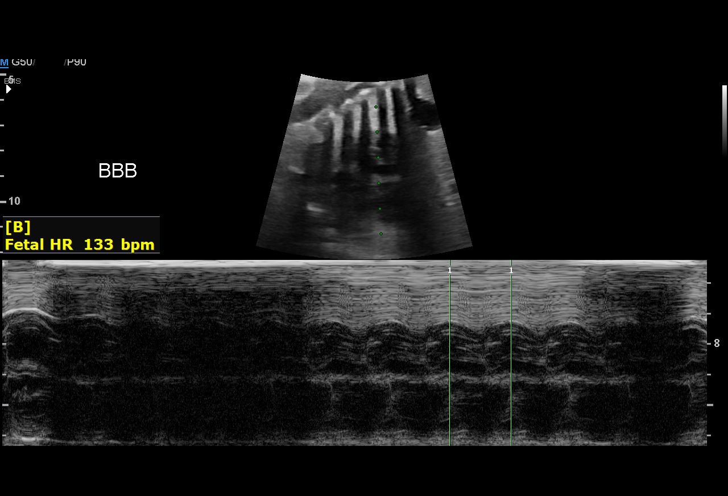
[im 13/18]
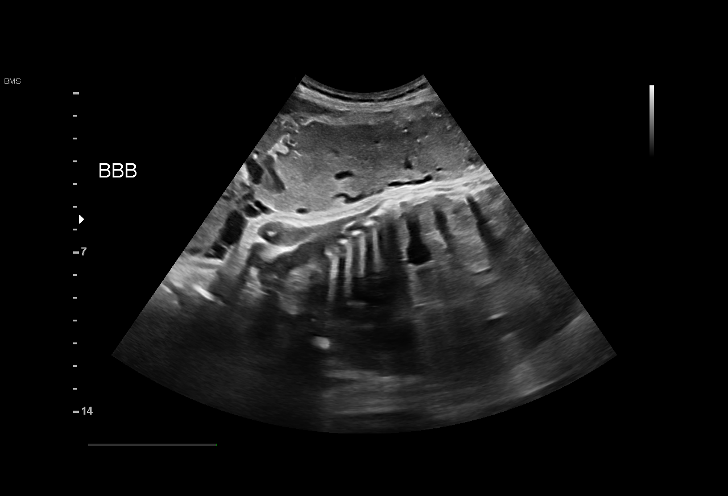
[im 14/18]
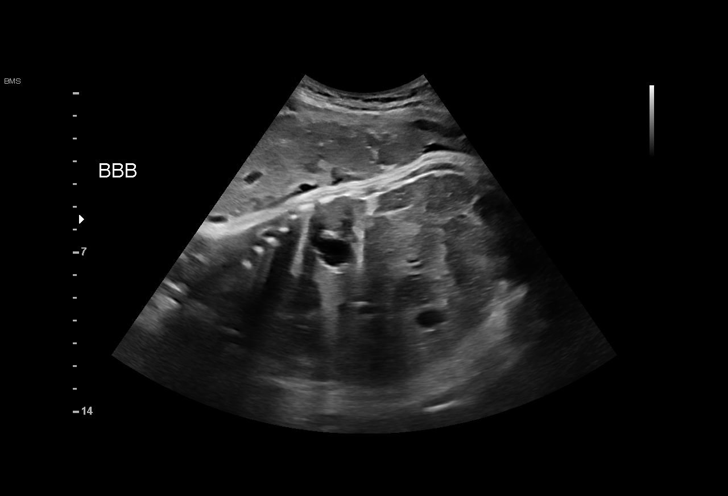
[im 16/18]
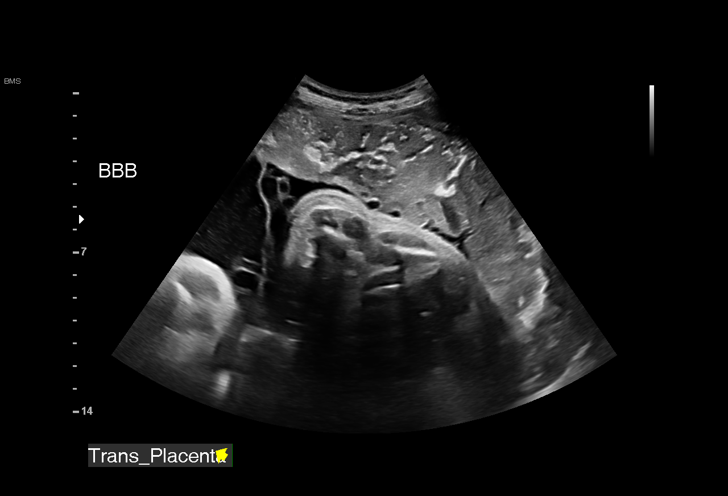
[im 17/18]
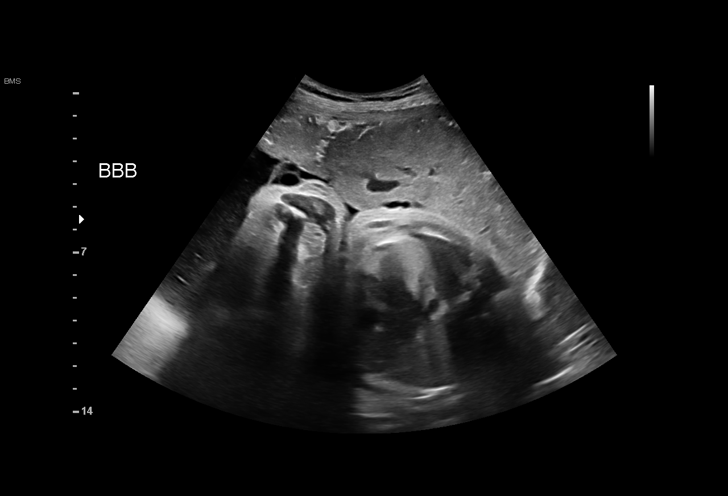
[im 18/18]
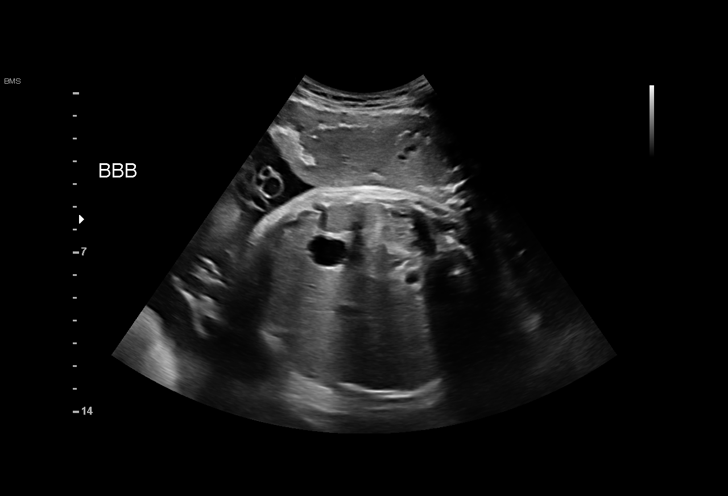

[15 of 18 positions shown; findings below may reference images not displayed]

1  [HOSPITAL]                         76815.0     IVAS TAF

Indications

 Twin pregnancy, di/di, third trimester
 Abnormal fetal presentation
 39 weeks gestation of pregnancy
Fetal Evaluation (Fetus A)

 Num Of Fetuses:         2
 Fetal Heart Rate(bpm):  137
 Cardiac Activity:       Observed
 Presentation:           Cephalic
 Placenta:               Anterior

 Amniotic Fluid
 AFI FV:      Within normal limits

                             Largest Pocket(cm)

OB History

 Gravidity:    3
 Living:       2
Gestational Age (Fetus A)

 Clinical EDD:  39w 0d                                        EDD:   12/20/19
 Best:          39w 0d     Det. By:  Clinical EDD             EDD:   12/20/19
Fetal Evaluation (Fetus B)

 Num Of Fetuses:         2
 Fetal Heart Rate(bpm):  133
 Cardiac Activity:       Observed
 Presentation:           Breech
 Placenta:               Anterior

 Amniotic Fluid
 AFI FV:      Within normal limits

                             Largest Pocket(cm)

Gestational Age (Fetus B)

 Clinical EDD:  39w 0d                                        EDD:   12/20/19
 Best:          39w 0d     Det. By:  Clinical EDD             EDD:   12/20/19
Cervix Uterus Adnexa

 Cervix
 Not visualized (advanced GA >01wks)
Impression

 Ultrasound was requested to check fetal presentations in
 twins.
 Dichorionic-diamniotic twin pregnancy.

 Twin A: Cephalic presentation, anterior placenta.  Normal
 amniotic fluid.

 Twin B: Breech presentation, anterior placenta.  Normal
 amniotic fluid.
                 Benites, Adams

## 2022-02-16 DIAGNOSIS — Z01419 Encounter for gynecological examination (general) (routine) without abnormal findings: Secondary | ICD-10-CM | POA: Diagnosis not present

## 2022-03-16 DIAGNOSIS — D2239 Melanocytic nevi of other parts of face: Secondary | ICD-10-CM | POA: Diagnosis not present

## 2022-03-16 DIAGNOSIS — L700 Acne vulgaris: Secondary | ICD-10-CM | POA: Diagnosis not present
